# Patient Record
Sex: Female | Born: 1950 | Race: White | Hispanic: No | Marital: Married | Smoking: Never smoker
Health system: Southern US, Community
[De-identification: ages and names within clinical notes are randomized; demographics above are authoritative.]

## PROBLEM LIST (undated history)

## (undated) DIAGNOSIS — R531 Weakness: Secondary | ICD-10-CM

## (undated) DIAGNOSIS — M3500 Sicca syndrome, unspecified: Secondary | ICD-10-CM

## (undated) DIAGNOSIS — Q211 Atrial septal defect: Secondary | ICD-10-CM

## (undated) DIAGNOSIS — R202 Paresthesia of skin: Secondary | ICD-10-CM

## (undated) DIAGNOSIS — K900 Celiac disease: Secondary | ICD-10-CM

## (undated) DIAGNOSIS — E669 Obesity, unspecified: Secondary | ICD-10-CM

## (undated) DIAGNOSIS — R55 Syncope and collapse: Secondary | ICD-10-CM

## (undated) DIAGNOSIS — G2581 Restless legs syndrome: Secondary | ICD-10-CM

## (undated) DIAGNOSIS — R0789 Other chest pain: Secondary | ICD-10-CM

## (undated) DIAGNOSIS — R200 Anesthesia of skin: Secondary | ICD-10-CM

## (undated) DIAGNOSIS — R002 Palpitations: Secondary | ICD-10-CM

## (undated) DIAGNOSIS — E039 Hypothyroidism, unspecified: Secondary | ICD-10-CM

## (undated) DIAGNOSIS — I10 Essential (primary) hypertension: Secondary | ICD-10-CM

## (undated) DIAGNOSIS — I519 Heart disease, unspecified: Secondary | ICD-10-CM

## (undated) DIAGNOSIS — Q2112 Patent foramen ovale: Secondary | ICD-10-CM

## (undated) DIAGNOSIS — E063 Autoimmune thyroiditis: Secondary | ICD-10-CM

## (undated) DIAGNOSIS — M1990 Unspecified osteoarthritis, unspecified site: Secondary | ICD-10-CM

## (undated) DIAGNOSIS — Z8673 Personal history of transient ischemic attack (TIA), and cerebral infarction without residual deficits: Secondary | ICD-10-CM

## (undated) HISTORY — PX: COLON RESECTION: SHX5231

## (undated) HISTORY — DX: Celiac disease: K90.0

## (undated) HISTORY — DX: Paresthesia of skin: R20.2

## (undated) HISTORY — DX: Heart disease, unspecified: I51.9

## (undated) HISTORY — DX: Anesthesia of skin: R20.0

## (undated) HISTORY — DX: Personal history of transient ischemic attack (TIA), and cerebral infarction without residual deficits: Z86.73

## (undated) HISTORY — DX: Palpitations: R00.2

## (undated) HISTORY — DX: Sjogren syndrome, unspecified: M35.00

## (undated) HISTORY — DX: Weakness: R53.1

## (undated) HISTORY — DX: Essential (primary) hypertension: I10

## (undated) HISTORY — DX: Hypothyroidism, unspecified: E03.9

## (undated) HISTORY — PX: MENISCUS REPAIR: SHX5179

## (undated) HISTORY — DX: Autoimmune thyroiditis: E06.3

## (undated) HISTORY — DX: Atrial septal defect: Q21.1

## (undated) HISTORY — DX: Other chest pain: R07.89

## (undated) HISTORY — DX: Patent foramen ovale: Q21.12

## (undated) HISTORY — DX: Restless legs syndrome: G25.81

## (undated) HISTORY — DX: Syncope and collapse: R55

## (undated) HISTORY — DX: Obesity, unspecified: E66.9

## (undated) HISTORY — DX: Unspecified osteoarthritis, unspecified site: M19.90

## (undated) HISTORY — PX: APPENDECTOMY: SHX54

---

## 1980-10-14 HISTORY — PX: ABDOMINAL HYSTERECTOMY: SHX81

## 1995-10-15 HISTORY — PX: CERVICAL SPINE SURGERY: SHX589

## 2004-09-04 ENCOUNTER — Emergency Department: Payer: Self-pay | Admitting: General Practice

## 2005-12-27 ENCOUNTER — Ambulatory Visit: Payer: Self-pay | Admitting: Internal Medicine

## 2006-01-28 ENCOUNTER — Ambulatory Visit: Payer: Self-pay | Admitting: Internal Medicine

## 2006-03-25 ENCOUNTER — Ambulatory Visit: Payer: Self-pay | Admitting: Cardiology

## 2006-04-30 ENCOUNTER — Ambulatory Visit: Payer: Self-pay | Admitting: Internal Medicine

## 2008-06-07 ENCOUNTER — Encounter (HOSPITAL_COMMUNITY): Admission: RE | Admit: 2008-06-07 | Discharge: 2008-08-16 | Payer: Self-pay | Admitting: Endocrinology

## 2008-09-14 ENCOUNTER — Encounter (HOSPITAL_COMMUNITY): Admission: RE | Admit: 2008-09-14 | Discharge: 2008-10-13 | Payer: Self-pay | Admitting: Endocrinology

## 2008-10-14 HISTORY — PX: ASD REPAIR, SECUNDUM: SHX1195

## 2008-12-12 ENCOUNTER — Encounter: Admission: RE | Admit: 2008-12-12 | Discharge: 2008-12-12 | Payer: Self-pay | Admitting: Family Medicine

## 2008-12-23 ENCOUNTER — Encounter: Payer: Self-pay | Admitting: Emergency Medicine

## 2008-12-23 ENCOUNTER — Observation Stay (HOSPITAL_COMMUNITY): Admission: AD | Admit: 2008-12-23 | Discharge: 2008-12-24 | Payer: Self-pay | Admitting: Neurology

## 2008-12-23 ENCOUNTER — Encounter (INDEPENDENT_AMBULATORY_CARE_PROVIDER_SITE_OTHER): Payer: Self-pay | Admitting: Neurology

## 2008-12-23 ENCOUNTER — Ambulatory Visit: Payer: Self-pay | Admitting: Cardiology

## 2009-03-01 ENCOUNTER — Ambulatory Visit (HOSPITAL_COMMUNITY): Admission: RE | Admit: 2009-03-01 | Discharge: 2009-03-01 | Payer: Self-pay | Admitting: Cardiology

## 2009-03-01 ENCOUNTER — Ambulatory Visit: Payer: Self-pay | Admitting: Vascular Surgery

## 2009-03-01 ENCOUNTER — Encounter (HOSPITAL_COMMUNITY): Admission: RE | Admit: 2009-03-01 | Discharge: 2009-05-30 | Payer: Self-pay | Admitting: Endocrinology

## 2009-03-01 ENCOUNTER — Encounter (INDEPENDENT_AMBULATORY_CARE_PROVIDER_SITE_OTHER): Payer: Self-pay | Admitting: Cardiology

## 2009-03-24 ENCOUNTER — Inpatient Hospital Stay (HOSPITAL_COMMUNITY): Admission: AD | Admit: 2009-03-24 | Discharge: 2009-03-25 | Payer: Self-pay | Admitting: Cardiology

## 2009-03-24 ENCOUNTER — Encounter (INDEPENDENT_AMBULATORY_CARE_PROVIDER_SITE_OTHER): Payer: Self-pay | Admitting: Cardiology

## 2009-06-28 ENCOUNTER — Encounter (HOSPITAL_COMMUNITY): Admission: RE | Admit: 2009-06-28 | Discharge: 2009-08-23 | Payer: Self-pay | Admitting: Endocrinology

## 2009-08-21 ENCOUNTER — Encounter (HOSPITAL_COMMUNITY): Admission: RE | Admit: 2009-08-21 | Discharge: 2009-08-22 | Payer: Self-pay | Admitting: Endocrinology

## 2010-02-07 ENCOUNTER — Encounter (HOSPITAL_COMMUNITY): Admission: RE | Admit: 2010-02-07 | Discharge: 2010-04-26 | Payer: Self-pay | Admitting: Endocrinology

## 2010-07-07 ENCOUNTER — Emergency Department (HOSPITAL_COMMUNITY): Admission: EM | Admit: 2010-07-07 | Discharge: 2010-07-07 | Payer: Self-pay | Admitting: Emergency Medicine

## 2010-07-11 ENCOUNTER — Inpatient Hospital Stay (HOSPITAL_COMMUNITY): Admission: EM | Admit: 2010-07-11 | Discharge: 2010-07-18 | Payer: Self-pay | Admitting: Emergency Medicine

## 2010-07-13 ENCOUNTER — Encounter (INDEPENDENT_AMBULATORY_CARE_PROVIDER_SITE_OTHER): Payer: Self-pay | Admitting: Internal Medicine

## 2010-09-01 ENCOUNTER — Ambulatory Visit: Payer: Self-pay | Admitting: Psychiatry

## 2010-11-22 ENCOUNTER — Other Ambulatory Visit (HOSPITAL_COMMUNITY): Payer: Self-pay | Admitting: Obstetrics and Gynecology

## 2010-12-11 ENCOUNTER — Ambulatory Visit (INDEPENDENT_AMBULATORY_CARE_PROVIDER_SITE_OTHER): Admitting: Cardiology

## 2010-12-11 DIAGNOSIS — Q211 Atrial septal defect: Secondary | ICD-10-CM

## 2010-12-27 LAB — POCT I-STAT, CHEM 8
BUN: 8 mg/dL (ref 6–23)
Calcium, Ion: 1.12 mmol/L (ref 1.12–1.32)
Chloride: 110 mEq/L (ref 96–112)
Creatinine, Ser: 0.6 mg/dL (ref 0.4–1.2)
Glucose, Bld: 103 mg/dL — ABNORMAL HIGH (ref 70–99)
TCO2: 24 mmol/L (ref 0–100)

## 2010-12-27 LAB — CBC
HCT: 32.3 % — ABNORMAL LOW (ref 36.0–46.0)
HCT: 34.5 % — ABNORMAL LOW (ref 36.0–46.0)
HCT: 34.7 % — ABNORMAL LOW (ref 36.0–46.0)
HCT: 37.5 % (ref 36.0–46.0)
Hemoglobin: 13.5 g/dL (ref 12.0–15.0)
MCH: 28.3 pg (ref 26.0–34.0)
MCH: 28.4 pg (ref 26.0–34.0)
MCH: 28.4 pg (ref 26.0–34.0)
MCHC: 33.4 g/dL (ref 30.0–36.0)
MCHC: 33.6 g/dL (ref 30.0–36.0)
MCHC: 33.7 g/dL (ref 30.0–36.0)
MCHC: 33.9 g/dL (ref 30.0–36.0)
MCHC: 33.9 g/dL (ref 30.0–36.0)
MCV: 82.2 fL (ref 78.0–100.0)
MCV: 83.3 fL (ref 78.0–100.0)
MCV: 84.3 fL (ref 78.0–100.0)
MCV: 84.4 fL (ref 78.0–100.0)
Platelets: 173 10*3/uL (ref 150–400)
Platelets: 173 10*3/uL (ref 150–400)
Platelets: 178 10*3/uL (ref 150–400)
Platelets: 180 10*3/uL (ref 150–400)
Platelets: 197 10*3/uL (ref 150–400)
Platelets: 217 10*3/uL (ref 150–400)
Platelets: 225 10*3/uL (ref 150–400)
RBC: 4.78 MIL/uL (ref 3.87–5.11)
RDW: 15.9 % — ABNORMAL HIGH (ref 11.5–15.5)
RDW: 15.9 % — ABNORMAL HIGH (ref 11.5–15.5)
RDW: 16 % — ABNORMAL HIGH (ref 11.5–15.5)
RDW: 16.1 % — ABNORMAL HIGH (ref 11.5–15.5)
RDW: 16.2 % — ABNORMAL HIGH (ref 11.5–15.5)
RDW: 16.3 % — ABNORMAL HIGH (ref 11.5–15.5)
WBC: 11.7 10*3/uL — ABNORMAL HIGH (ref 4.0–10.5)
WBC: 4 10*3/uL (ref 4.0–10.5)
WBC: 7 10*3/uL (ref 4.0–10.5)

## 2010-12-27 LAB — DIFFERENTIAL
Basophils Absolute: 0 10*3/uL (ref 0.0–0.1)
Basophils Absolute: 0 10*3/uL (ref 0.0–0.1)
Basophils Relative: 0 % (ref 0–1)
Eosinophils Absolute: 0.1 10*3/uL (ref 0.0–0.7)
Eosinophils Relative: 0 % (ref 0–5)
Eosinophils Relative: 1 % (ref 0–5)
Lymphocytes Relative: 9 % — ABNORMAL LOW (ref 12–46)
Monocytes Absolute: 0.5 10*3/uL (ref 0.1–1.0)
Monocytes Absolute: 0.8 10*3/uL (ref 0.1–1.0)
Neutro Abs: 10.1 10*3/uL — ABNORMAL HIGH (ref 1.7–7.7)

## 2010-12-27 LAB — COMPREHENSIVE METABOLIC PANEL
ALT: 26 U/L (ref 0–35)
AST: 23 U/L (ref 0–37)
AST: 24 U/L (ref 0–37)
Albumin: 2.9 g/dL — ABNORMAL LOW (ref 3.5–5.2)
Albumin: 3.3 g/dL — ABNORMAL LOW (ref 3.5–5.2)
Alkaline Phosphatase: 34 U/L — ABNORMAL LOW (ref 39–117)
Alkaline Phosphatase: 39 U/L (ref 39–117)
BUN: 1 mg/dL — ABNORMAL LOW (ref 6–23)
BUN: 13 mg/dL (ref 6–23)
CO2: 26 mEq/L (ref 19–32)
Chloride: 108 mEq/L (ref 96–112)
GFR calc Af Amer: >60 mL/min (ref >60)
GFR calc non Af Amer: >60 mL/min (ref >60)
Potassium: 3.4 mEq/L — ABNORMAL LOW (ref 3.5–5.1)
Potassium: 3.7 mEq/L (ref 3.5–5.1)
Sodium: 144 mEq/L (ref 135–145)
Total Bilirubin: 1 mg/dL (ref 0.3–1.2)
Total Protein: 5.2 g/dL — ABNORMAL LOW (ref 6.0–8.3)

## 2010-12-27 LAB — URINALYSIS, ROUTINE W REFLEX MICROSCOPIC
Bilirubin Urine: NEGATIVE
Bilirubin Urine: NEGATIVE
Glucose, UA: NEGATIVE mg/dL
Glucose, UA: NEGATIVE mg/dL
Hgb urine dipstick: NEGATIVE
Hgb urine dipstick: NEGATIVE
Ketones, ur: 15 mg/dL — AB
Ketones, ur: NEGATIVE mg/dL
Nitrite: NEGATIVE
Specific Gravity, Urine: 1.02 (ref 1.005–1.030)
pH: 6 (ref 5.0–8.0)
pH: 6 (ref 5.0–8.0)

## 2010-12-27 LAB — STOOL CULTURE

## 2010-12-27 LAB — ABO/RH: ABO/RH(D): O POS

## 2010-12-27 LAB — GIARDIA/CRYPTOSPORIDIUM SCREEN(EIA): Giardia Screen - EIA: NEGATIVE

## 2010-12-27 LAB — URINE MICROSCOPIC-ADD ON

## 2010-12-27 LAB — GASTRIN: Gastrin: 15 pg/mL (ref <101)

## 2010-12-27 LAB — RETICULOCYTES
RBC.: 4.28 MIL/uL (ref 3.87–5.11)
Retic Ct Pct: 0.9 % (ref 0.4–3.1)

## 2010-12-27 LAB — IRON AND TIBC
Saturation Ratios: 12 % — ABNORMAL LOW (ref 20–55)
UIBC: 330 ug/dL

## 2010-12-27 LAB — FOLATE: Folate: 9.7 ng/mL

## 2010-12-27 LAB — T4, FREE: Free T4: 0.66 ng/dL — ABNORMAL LOW (ref 0.80–1.80)

## 2010-12-27 LAB — CK: Total CK: 98 U/L (ref 7–177)

## 2011-01-21 LAB — POCT I-STAT 3, VENOUS BLOOD GAS (G3P V)
Bicarbonate: 24.6 mEq/L — ABNORMAL HIGH (ref 20.0–24.0)
O2 Saturation: 73 %
O2 Saturation: 77 %
O2 Saturation: 84 %
TCO2: 25 mmol/L (ref 0–100)
TCO2: 26 mmol/L (ref 0–100)
pCO2, Ven: 36.4 mmHg — ABNORMAL LOW (ref 45.0–50.0)
pCO2, Ven: 39.9 mmHg — ABNORMAL LOW (ref 45.0–50.0)
pH, Ven: 7.38 — ABNORMAL HIGH (ref 7.250–7.300)
pH, Ven: 7.396 — ABNORMAL HIGH (ref 7.250–7.300)
pO2, Ven: 48 mmHg — ABNORMAL HIGH (ref 30.0–45.0)

## 2011-01-21 LAB — BASIC METABOLIC PANEL
Chloride: 103 mEq/L (ref 96–112)
GFR calc Af Amer: >60 mL/min (ref >60)
Potassium: 3.4 mEq/L — ABNORMAL LOW (ref 3.5–5.1)
Sodium: 137 mEq/L (ref 135–145)

## 2011-01-21 LAB — CBC
HCT: 37 % (ref 36.0–46.0)
MCV: 84.3 fL (ref 78.0–100.0)
RBC: 4.39 MIL/uL (ref 3.87–5.11)
WBC: 7.3 10*3/uL (ref 4.0–10.5)

## 2011-01-24 LAB — URINALYSIS, ROUTINE W REFLEX MICROSCOPIC
Nitrite: NEGATIVE
Specific Gravity, Urine: 1.02 (ref 1.005–1.030)
pH: 6 (ref 5.0–8.0)

## 2011-01-24 LAB — COMPREHENSIVE METABOLIC PANEL
ALT: 25 U/L (ref 0–35)
Alkaline Phosphatase: 68 U/L (ref 39–117)
Glucose, Bld: 100 mg/dL — ABNORMAL HIGH (ref 70–99)
Potassium: 3.9 mEq/L (ref 3.5–5.1)
Sodium: 140 mEq/L (ref 135–145)
Total Protein: 6.7 g/dL (ref 6.0–8.3)

## 2011-01-24 LAB — C-REACTIVE PROTEIN: CRP: 0.1 mg/dL — ABNORMAL LOW (ref <0.6)

## 2011-01-24 LAB — GLUCOSE, CAPILLARY: Glucose-Capillary: 114 mg/dL — ABNORMAL HIGH (ref 70–99)

## 2011-01-24 LAB — DIFFERENTIAL
Basophils Relative: 1 % (ref 0–1)
Eosinophils Absolute: 0.2 10*3/uL (ref 0.0–0.7)
Eosinophils Relative: 4 % (ref 0–5)
Monocytes Absolute: 0.6 10*3/uL (ref 0.1–1.0)
Monocytes Relative: 8 % (ref 3–12)
Neutrophils Relative %: 56 % (ref 43–77)

## 2011-01-24 LAB — URINE MICROSCOPIC-ADD ON

## 2011-01-24 LAB — ANA
Anti Nuclear Antibody(ANA): POSITIVE — AB
Anti Nuclear Antibody(ANA): POSITIVE — AB

## 2011-01-24 LAB — ANTI-NUCLEAR AB-TITER (ANA TITER): ANA Titer 1: NEGATIVE

## 2011-01-24 LAB — CBC
Hemoglobin: 13.8 g/dL (ref 12.0–15.0)
MCHC: 33.5 g/dL (ref 30.0–36.0)
MCV: 87.2 fL (ref 78.0–100.0)
RBC: 4.73 MIL/uL (ref 3.87–5.11)

## 2011-01-24 LAB — CK TOTAL AND CKMB (NOT AT ARMC): CK, MB: 6.3 ng/mL — ABNORMAL HIGH (ref 0.3–4.0)

## 2011-01-24 LAB — HEMOGLOBIN A1C
Hgb A1c MFr Bld: 5.9 % (ref 4.6–6.1)
Mean Plasma Glucose: 123 mg/dL

## 2011-01-24 LAB — LIPID PANEL: VLDL: 13 mg/dL (ref 0–40)

## 2011-01-24 LAB — B. BURGDORFI ANTIBODIES: B burgdorferi Ab IgG+IgM: 0.05 {ISR}

## 2011-01-24 LAB — RAPID URINE DRUG SCREEN, HOSP PERFORMED: Tetrahydrocannabinol: NOT DETECTED

## 2011-01-24 LAB — HOMOCYSTEINE: Homocysteine: 5.9 umol/L (ref 4.0–15.4)

## 2011-01-24 LAB — CARDIAC PANEL(CRET KIN+CKTOT+MB+TROPI): Relative Index: 2.4 (ref 0.0–2.5)

## 2011-02-26 NOTE — Discharge Summary (Signed)
Erica Wheeler, Erica Wheeler NO.:  [id number]   MEDICAL RECORD NO.:  [id number]          PATIENT TYPE:  INP   LOCATION:  2507                         FACILITY:  [facility]   PHYSICIAN:  Cristy Hilts. Jacinto Halim, MD       DATE OF BIRTH:  11/24/1950   DATE OF ADMISSION:  03/24/2009  DATE OF DISCHARGE:  03/25/2009                               DISCHARGE SUMMARY   DISCHARGE DIAGNOSES:  1. Atrial septal defect.      a.     Closure of atrial septal defect with septal occluder.  2. History of multiple transient ischemic attacks.  3. Premature family history of coronary disease, vascular disease, and      stroke.  4. Hypertension, treated.   DISCHARGE CONDITION:  Stable.   PROCEDURES:  Patent foramen ovale closure, on March 24, 2009, by Dr. Yates Decamp.   DISCHARGE MEDICATIONS:  1. Lovaza 1000 mg daily.  2. Vitamin D 50,000 international units weekly.  3. Shaklee vitamin three times daily.  4. Vitamin B12 shots every other week.  5. Armour Thyroid 60 mg one and a half daily.  6. Lisinopril and hydrochlorothiazide 20/12.5 daily.  7. Aspirin 81 mg daily.  8. Plavix 75 mg one daily for 3 months.   DISCHARGE INSTRUCTIONS:  1. May return to work on April 18, 2009.  2. Low-sodium heart-healthy diet.  3. Wash cath site with soap and water.  Call if any bleeding,      swelling, or drainage.  4. Increase activity slowly.  May shower.  No lifting for 1 week.  No      driving for 2 days.  5. You will need a 2D echo in 6 months.  Office will call and arrange      with you.  6. Follow up with Dr. Garen Lah in 4 months.   HISTORY OF PRESENT ILLNESS:  A 60 year old female with multiple episodes  of TIA, last TIA December 23, 2008, was seen by Dr. Jacinto Halim after a  transcranial bubble study was done to evaluate paradoxical shunting,  which revealed intracardiac shunting of at least moderate size.  She  also had complained of shortness of breath, dyspnea on exertion, and  some chest discomfort.  She was  seen and evaluated.  Also, has a history  for labile hypertension.  She was set up for PFO closure.   The patient presented electively to [facility] on March 24, 2009, and  underwent closure of moderate-sized ASD and tolerated procedure well.  Next morning, she was stable.  Vital signs were stable.  Blood pressure  107/38, pulse 60, temperature 98.1.  Labs were stable with hemoglobin  12.4, hematocrit 37.  Potassium was 3.4.  She was given 40 mEq of  potassium prior to discharge.  Heart, S1 and S2, no gallop.  Lungs were  clear.  Right groin was without  hematoma.  She did have 2D echo results, Dr. Jacinto Halim reviewed and are  currently pending in the computer at the time of discharge.   The patient will follow up as an outpatient and will  call if she has  problems in the meantime.      Darcella Gasman. Ingold, N.P.      Cristy Hilts. Jacinto Halim, MD     LRI/MEDQ  D:  03/25/2009  T:  03/26/2009  Job:  [id number]   cc:   Genene Churn. Love, M.D.  Dorisann Frames, M.D.

## 2011-02-26 NOTE — Cardiovascular Report (Signed)
NAMEJAE, BRUCK NO.:  [id number]   MEDICAL RECORD NO.:  [id number]          PATIENT TYPE:  INP   LOCATION:  2807                         FACILITY:  [facility]   PHYSICIAN:  Cristy Hilts. Jacinto Halim, MD       DATE OF BIRTH:  [birth year]   DATE OF PROCEDURE:  03/24/2009  DATE OF DISCHARGE:                            CARDIAC CATHETERIZATION   PROCEDURE PERFORMED:  Intracardiac echo guided closure of the secundum-  type atrial septal defect.   INDICATIONS:  Shortness of breath, right ventricular strain, and TIA.   The patient is a 60 year old female with history of multiple episodes of  TIA.  Last TIA on December 23, 2008.  She has also been complaining of  shortness of breath and dyspnea on exertion, which is significantly  worse than the last 1 year.  A transesophageal echocardiogram to  evaluate for cardiac source of cerebral emboli had yielded a small-to-  moderate sized atrial septal defect of secundum type.  Hence she is now  brought to the catheterization lab for intracardiac echo-guided closure  of the same.   Intracardiac echo guidance revealed presence of a 9-mm atrial septal  defect of secundum type.  There is also a small fenestrated septum seen  just adjacent to the defect.   QP:QS calculation was performed by obtaining SVC, IVC, RA, LA  saturations and was calculated to be 2.1.   INTERVENTION DATA:  Successful closure of the secundum-type atrial  septal defect with implantation of a 10-mm ASO Amplatzer septal occluder  with very minimal residual leak through the device.  Excellent  positioning of the device was noted.   RECOMMENDATIONS:  I expect complete closure of the atrial septal defect  over the next few weeks to few months.  She will be placed on aspirin 81  mg 2 tablets p.o. daily indefinitely and Plavix 75 mg p.o. daily for at  least 3 months.  She will need endocarditis prophylaxis for a total of 6  months.   An echocardiogram will be performed in  the morning prior to her  discharge to exclude pericardial effusion.   TECHNIQUE OF PROCEDURE:  Under usual sterile precautions using a 8-  [place] right femoral venous and a 9-French right femoral venous access,  intracardiac echo probe was advanced and the intracardiac structures  were carefully analyzed.  There was no obvious valvular abnormalities  noted.  Then we evaluated the atrial septal defect.  A 6-French  multipurpose A2 catheter was utilized to cross the atrial septal defect  and the defect was crossed and the catheter was placed into the left  inferior pulmonary vein.  Using a 300-cm Amplatzer wire, the  multipurpose catheter was withdrawn and a septal occluder sizing balloon  was utilized and careful inflation until the color flow completely  stopped, revealed a 9 mm defect both by fluoroscopic measurement and  also by intracardiac measurements.  Having obtained good sizing, we  decided to go with a 10-mm septal occluder implantation, which was  performed in the usual fashion.  A 9-French right femoral venous sheath  was  placed over the Amplatzer wire and the sheath was carefully crossed  and placed into the middle of the left atrium.  The ASO device was  carefully prepped in the usual fashion making sure that there was no air  in the catheter and the device was carefully advanced through the  introducer sheath into left atrium and left atrial disk was carefully  deployed under fluoroscopic and also intracardiac echo guidance.  After  careful positioning of the left atrial disk, right atrial disk was  released.  [place] tug was carefully performed to evaluate the  positioning and stability of the device and then the device was released  with  excellent results.  The 9-French delivery sheath was then exchanged to a  short 9-French sheath.  During the process, intravenous heparin was  administered and ACT was maintained greater than 200.  The patient  tolerated the  procedure well.  There were no immediate complications  noted.      Cristy Hilts. Jacinto Halim, MD  Electronically Signed     JRG/MEDQ  D:  03/24/2009  T:  03/24/2009  Job:  [id number]   cc:   Evie Lacks, MD

## 2011-02-26 NOTE — H&P (Signed)
NAMEDIANAH, PRUETT NO.:  [id number]   MEDICAL RECORD NO.:  [id number]          PATIENT TYPE:  INP   LOCATION:  3007                         FACILITY:  [facility]   PHYSICIAN:  Melvyn Novas, M.D.  DATE OF BIRTH:  [birth year]   DATE OF ADMISSION:  12/23/2008  DATE OF DISCHARGE:                              HISTORY & PHYSICAL   She was seen at the [facility] Emergency Room prior to transfer to  [facility] Stroke Unit.   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 to take  her grandson to school.  Her daughter called her and felt that her  mother was confused and upset yet she spoke clearly.  She was very emotional even then about the incident.  When the daughter came here to the ER to see her mother at the bedside,  she stated that since her telephone conversation at 8 a.m., now at 11  a.m., her mother's speech has deteriorated, she is nonfluent and her  spontaneous verbal expressions are redundant.  She repeats herself  multiple times.  She has trouble reading..  She has trouble repeating.  Interestingly, Ms. Brueggemann drove here by private car.  Ms. Hoggard is a  patient of Dr. Evelena Peat and Dr. Dorisann Frames who follows her  for thyroid disease.  She has a history of TIAs 10 years ago and lived  in [place] at that time.  She took baby aspirin for a while.  She does not recall what medication  she is on now, but she is on a thyroid medication.  She does not states that she takes aspirin now daily.  She has an  appointment on January 04, 2009, with Dr. Sandria Manly at our office, but has not  yet been seen.  A CT had been ordered through a primary care physician,  I suppose Dr. Caryl Never, on December 12, 2008 showed a hypodensity on an  unenhanced CT in the neighborhood of the left posterior horn.  She has now had a repeat CT, although we requested this to be cancelled  prior to seeing the patient and this repeat CT shows in my opinion no  change.  She is awaiting an MRI right now.   CURRENT MEDICATIONS:  Cannot be named.   ALLERGIES:  Cannot be named.   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  She is repetitively saying I am a banker, I need to think  clear, I need to think clear, I need to think clear.   She cannot give me a family history.   PHYSICAL EXAMINATION:  VITAL SIGNS:  145/70 mmHg blood pressure, heart  rate 76 beats per minute and regular, respiratory rate 16-18.  LUNGS:  Clear to auscultation.  COR:  Regular rate and rhythm.  No murmur.  No carotid bruit.  No  temporal artery induration.  Normal pulses.  No swelling, edema, or  rash, abdominal pain, chest pain, or paraspinal tenderness.  The patient is crying and appears emotionally very taxed.  She is  concerned about her inability to speak fluently and repeats multiple  times something is wrong with me.  She cannot name spontaneously.  Repetition is haltered and her spontaneous output of speech is poor.   She has equally reactive pupils nystagmus to the gaze, appears to have  left lower facial droop, but no gaze preference, inability to whistle,  blow up her cheeks, or close her eyes tightly.  Her daughter states that she sees no change in her facial expression in  comparison to the last couple of days.  She has sensory loss over the  right face in all three branches of the trigeminal nerve.  She also states that primary modalities such as touch filament and  temperature in arm, shoulder leg on the right side and part of the right  trunk are decreased.  She feels numbish, but she denies having pain or having tingling  sensation.  Right grip appeared equal to the left now, but was initially describes  as weaker.  There is a pronator drift on the right, but equal  deep  tendon reflexes.  Finger-nose-finger shows no dysmetria.  She has downgoing toes.  No leg drift.  She states that her right leg is cramping as she rubs it and massages  it, but her gait was unimpaired when she walked to the bathroom  witnessed by her ER physician previously.  She is now little calmer and I have again tried for her to name objects  on a paper sheet and to describe the picture of the NIH stroke scale  with a cookie jar.  She is able to do this, but it takes her unusual long time and she often  haltered or stopped in the middle of the sentence.   ASSESSMENT:  Possible history of transient ischemic attacks for several  days.  Initiation of the CT scan on December 12, 2008, was based in events taking  place at that time.  She had at that time a right facial numbness and today new seems to be  the aphasia and headache in the right temple.  She believes she  developed those about 8 a.m., but she cannot be sure.  No tPA was given since the onset time is unclear and the NIH stroke  scale was only endorsed at 3 points.   PLAN:  1. MRI.  Transfer to [facility] for further workup with a Stroke Service for      possibleTIA- ICD: 434.91.  2. The patient will receive aspirin.  3. We will try to contact Dr. Willeen Cass office to see what medications      the patient is currently on especially her thyroid medication, and      also if the patient has listed aspirin or any multivitamin      supplement.  The patient will be admitted to Stroke MD for observational stay.      Melvyn Novas, M.D.  Electronically Signed     CD/MEDQ  D:  12/23/2008  T:  12/24/2008  Job:  [id number]

## 2011-02-26 NOTE — Discharge Summary (Signed)
NAMECHESSIE, NEUHARTH NO.:  [id number]   MEDICAL RECORD NO.:  [id number]          PATIENT TYPE:  INP   LOCATION:  3007                         FACILITY:  [facility]   PHYSICIAN:  Melvyn Novas, M.D.  DATE OF BIRTH:  [birth year]   DATE OF ADMISSION:  12/23/2008  DATE OF DISCHARGE:  12/24/2008                               DISCHARGE SUMMARY   Erica Wheeler was admitted for an observational stay under the presumed  diagnosis of TIA on December 23, 2008.  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  The patient underwent a TIA workup.  During this brief observational stay, which has been completed today on  December 24, 2008, and her 2D echo has not been read yet, but a preliminary  report stated no abnormalities.    A preliminary report for the vascular lab coronary Doppler study shows  no ICA stenosis and antegrade flow in the vertebral arteries.  C-  reactive protein was 0.1, cholesterol 139, LDL 72, and HDL 54.  The  patient had a normal urinalysis.  No evidence of UTI.  Negative tox  screen and alcohol screen.  HbA1c was 5.9 and troponin 0.04.  CK and CK-  MB were elevated; CK at 222 and CK-MB at 6.3.  Relative index was also  elevated at 2.8.  The patient had, however, a normal Chem-7 with  slightly elevated glucose, but she had not fastened.  SGOT and SGPT were  normal.  Alk phos was normal.  PT and INR were normal.  INR was 0.9.  H  and H was 13.8/14.2, platelet count was 198,000, and white blood cell  count 7.  EKG in the ER showed a possible digitalis effect and this  patient who is not on digitalis.   PHYSICAL EXAMINATION:  The patient reports persistent right sided  numbness.   Her MRI has been negative for an acute stroke that shows white matter  disease with a more advanced  degree in the left hemisphere.  Face and  body is not involved in this numbness, but sometimes has a dysaesthetic  sensation to it, but the patient has regained fluent speech.  She  appears still extremely anxious and somewhat jittery.    Given her past medical history of a difficult to control thyroid  dysfunction, I wonder if her mental status change and her degree of  anxiety could be related to the thyroid disease.  I explained to her that she did not suffer an acute stroke, but that she  should keep her outpatient workup with appointment with Dr. Sandria Manly for  next week.  The diagnosis at this time is TIA versus white matter disease of  demyelination or vasculitic origin.  We will repeat prior to her leaving  for home today as CK, CK-MBs, and troponin as well as an EKGs.  I have placed the patient on aspirin, which is the only new medicine  she  is on.     A vasculitis panel has been ordered.  Tylenol can be given p.r.n. for  headaches.  The patient will continue Dr. Willeen Cass prescription for  Armour Thyroid at 90 mcg daily and her multivitamins.  Her outpatient workup may include a depression and anxiety evaluation as  well as a neurologic examination.      Melvyn Novas, M.D.  Electronically Signed     CD/MEDQ  D:  12/24/2008  T:  12/24/2008  Job:  [id number]   cc:   Dorisann Frames, M.D.  Genene Churn. Love, M.D.

## 2011-04-02 ENCOUNTER — Inpatient Hospital Stay (HOSPITAL_COMMUNITY)
Admission: EM | Admit: 2011-04-02 | Discharge: 2011-04-05 | DRG: 556 | Disposition: A | Discharge status: Home / Self Care | Attending: Internal Medicine | Admitting: Internal Medicine

## 2011-04-02 ENCOUNTER — Emergency Department (HOSPITAL_COMMUNITY)

## 2011-04-02 DIAGNOSIS — IMO0001 Reserved for inherently not codable concepts without codable children: Principal | ICD-10-CM | POA: Diagnosis present

## 2011-04-02 DIAGNOSIS — Z8673 Personal history of transient ischemic attack (TIA), and cerebral infarction without residual deficits: Secondary | ICD-10-CM

## 2011-04-02 DIAGNOSIS — R197 Diarrhea, unspecified: Secondary | ICD-10-CM | POA: Diagnosis present

## 2011-04-02 DIAGNOSIS — F17200 Nicotine dependence, unspecified, uncomplicated: Secondary | ICD-10-CM | POA: Diagnosis present

## 2011-04-02 DIAGNOSIS — Z880 Allergy status to penicillin: Secondary | ICD-10-CM

## 2011-04-02 DIAGNOSIS — Z885 Allergy status to narcotic agent status: Secondary | ICD-10-CM

## 2011-04-02 DIAGNOSIS — E039 Hypothyroidism, unspecified: Secondary | ICD-10-CM | POA: Diagnosis present

## 2011-04-02 DIAGNOSIS — K5730 Diverticulosis of large intestine without perforation or abscess without bleeding: Secondary | ICD-10-CM | POA: Diagnosis present

## 2011-04-02 DIAGNOSIS — M3500 Sicca syndrome, unspecified: Secondary | ICD-10-CM | POA: Diagnosis present

## 2011-04-02 DIAGNOSIS — I10 Essential (primary) hypertension: Secondary | ICD-10-CM | POA: Diagnosis present

## 2011-04-02 DIAGNOSIS — E860 Dehydration: Secondary | ICD-10-CM | POA: Diagnosis present

## 2011-04-02 DIAGNOSIS — Z882 Allergy status to sulfonamides status: Secondary | ICD-10-CM

## 2011-04-02 DIAGNOSIS — K900 Celiac disease: Secondary | ICD-10-CM | POA: Diagnosis present

## 2011-04-02 LAB — POCT I-STAT, CHEM 8
BUN: 14 mg/dL (ref 6–23)
Chloride: 108 mEq/L (ref 96–112)
Creatinine, Ser: 0.6 mg/dL (ref 0.50–1.10)
Potassium: 3.7 mEq/L (ref 3.5–5.1)
Sodium: 140 mEq/L (ref 135–145)

## 2011-04-02 LAB — AMYLASE: Amylase: 59 U/L (ref 0–105)

## 2011-04-03 DIAGNOSIS — R195 Other fecal abnormalities: Secondary | ICD-10-CM

## 2011-04-03 DIAGNOSIS — R197 Diarrhea, unspecified: Secondary | ICD-10-CM

## 2011-04-03 LAB — CBC
MCH: 28.4 pg (ref 26.0–34.0)
MCHC: 34 g/dL (ref 30.0–36.0)
MCV: 83.6 fL (ref 78.0–100.0)
Platelets: 160 10*3/uL (ref 150–400)
RBC: 4.26 MIL/uL (ref 3.87–5.11)

## 2011-04-03 LAB — BASIC METABOLIC PANEL
BUN: 8 mg/dL (ref 6–23)
CO2: 27 mEq/L (ref 19–32)
Calcium: 8.5 mg/dL (ref 8.4–10.5)
Creatinine, Ser: <0.47 mg/dL — ABNORMAL LOW (ref 0.50–1.10)
Glucose, Bld: 97 mg/dL (ref 70–99)

## 2011-04-03 LAB — COMPREHENSIVE METABOLIC PANEL
ALT: 23 U/L (ref 0–35)
AST: 24 U/L (ref 0–37)
BUN: 8 mg/dL (ref 6–23)
CO2: 27 mEq/L (ref 19–32)
Calcium: 8.8 mg/dL (ref 8.4–10.5)
Creatinine, Ser: 0.51 mg/dL (ref 0.50–1.10)
GFR calc Af Amer: >60 mL/min (ref >60)
GFR calc non Af Amer: >60 mL/min (ref >60)
Sodium: 139 mEq/L (ref 135–145)
Total Protein: 5.5 g/dL — ABNORMAL LOW (ref 6.0–8.3)

## 2011-04-03 LAB — VITAMIN B12: Vitamin B-12: >2000 pg/mL — ABNORMAL HIGH (ref 211–911)

## 2011-04-03 LAB — FERRITIN: Ferritin: 59 ng/mL (ref 10–291)

## 2011-04-04 DIAGNOSIS — K900 Celiac disease: Secondary | ICD-10-CM

## 2011-04-04 DIAGNOSIS — R197 Diarrhea, unspecified: Secondary | ICD-10-CM

## 2011-04-04 DIAGNOSIS — IMO0001 Reserved for inherently not codable concepts without codable children: Secondary | ICD-10-CM

## 2011-04-04 LAB — BASIC METABOLIC PANEL
BUN: 10 mg/dL (ref 6–23)
Calcium: 8.9 mg/dL (ref 8.4–10.5)
GFR calc Af Amer: >60 mL/min (ref >60)
GFR calc non Af Amer: >60 mL/min (ref >60)
Glucose, Bld: 135 mg/dL — ABNORMAL HIGH (ref 70–99)

## 2011-04-04 LAB — VITAMIN D 25 HYDROXY (VIT D DEFICIENCY, FRACTURES): Vit D, 25-Hydroxy: 43 ng/mL (ref 30–89)

## 2011-04-05 LAB — BASIC METABOLIC PANEL
BUN: 9 mg/dL (ref 6–23)
Calcium: 9.2 mg/dL (ref 8.4–10.5)
Creatinine, Ser: 0.54 mg/dL (ref 0.50–1.10)
GFR calc Af Amer: >60 mL/min (ref >60)
GFR calc non Af Amer: >60 mL/min (ref >60)

## 2011-04-05 LAB — VITAMIN D 1,25 DIHYDROXY
Vitamin D 1, 25 (OH)2 Total: 56 pg/mL (ref 18–72)
Vitamin D3 1, 25 (OH)2: 56 pg/mL

## 2011-04-10 NOTE — Consult Note (Signed)
Erica Wheeler, Erica Wheeler NO.:  [id number]  MEDICAL RECORD NO.:  [id number]  LOCATION:  4508                         FACILITY:  [facility]  PHYSICIAN:  Jordan Hawks. Elnoria Howard, MD    DATE OF BIRTH:  18-Apr-1951  DATE OF CONSULTATION:  04/03/2011 DATE OF DISCHARGE:                                CONSULTATION   REASON FOR CONSULTATION:  Diarrhea.  PRIMARY CARE PROVIDER:  Warrick Parisian, MD  HISTORY OF PRESENT ILLNESS:  This is a 60 year old female with a past medical history Sj"gren's syndrome, celiac disease status post subtotal colectomy for diverticulitis, chronic diarrhea, Hashimoto's thyroiditis and fibromyalgia with severe myalgias who is admitted to the hospital with complaints of severe myalgia as well as a 2-3 month history of watery diarrhea.  The patient reports that her symptoms of myalgia started again 2-3 days after the death of her sister, unknown if this was accidental suicide or intentional.  Her sister lived in [place] and her husband is an alcoholic and her husband is not able to be found at this time.  She is the only one that will take responsibility for her sister's body and this has caused a significant amount of stress as legally it may not be possible for her to make arrangements if her husband is still alive.  Two to three days after finding out that her sister had passed, she started having the myalgia complaints again. Previously she was hospitalized approximately a year to year and a half ago with very similar complaints.  It was quite debilitating and she reported that she felt as if her muscles were being torn from her bone. She ultimately was evaluated at [facility] and the conclusion was that she had myalgia over the intervening time.  The symptoms seem to have resolved on its own and she has been doing quite well until this time.  Also her diarrhea restarted 2-3 months ago with 10-15 large watery bowel movements per day.  Since the  onset of her diarrhea, she has been trying to manage at home but at this time she finds it very difficult to manage the situation.  When she was hospitalized previously, it was noted that her celiac disease was in check.  Her titers were low, however, more importantly she is a very reliable patient and stays strictly on a gluten-free diet.  Despite being on a strict gluten-free diet, the patient had issues with diarrhea in the past.  It is as a result of this issue that GI consultation was requested.  Prior conservative therapies did not benefit her and there was a thought of using Lotronex but she was concerned about the side effects of the medications.  PAST MEDICAL AND PAST SURGICAL HISTORY:  As stated above.  FAMILY HISTORY:  Noncontributory.  SOCIAL HISTORY:  Negative for alcohol, tobacco, or illicit drug use.  ALLERGIES:  PENICILLIN, SULFA, and CODEINE.  MEDICATIONS: 1. Os-Cal 500 mg p.o. daily. 2. Vitamin D3 5000 units p.o. daily. 3. Vitamin B12 1000 mcg IM every 2 weeks. 4. Lomotil 2 tablets p.o. q.8 h. 5. Cymbalta 60 mg p.o. at bedtime. 6. Lovenox 40 mg subcu at bedtime. 7. Hydrochlorothiazide 12.5  mg p.o. daily. 8. Lisinopril 10 mg p.o. daily. 9. Progesterone 200 mg p.o. at bedtime. 10.Valium 5 mg IV q.4 h. p.r.n. 11.Morphine 4 mg IV q.2 h. p.r.n. 12.Zofran 4 mg IV q.6 h. 13.Ambien 5 mg p.o. at bedtime. 14.Levothyroxine 120 mg p.o. daily.  PHYSICAL EXAMINATION:  VITAL SIGNS:  Blood pressure is 120/68, heart rate is 74, temperature is 97.4. GENERAL:  The patient is in no acute distress, alert and oriented. HEENT:  Normocephalic, atraumatic.  Extraocular muscles intact. NECK:  Supple.  No lymphadenopathy. LUNGS:  Clear to auscultation bilaterally. CARDIOVASCULAR:  Regular rate and rhythm. ABDOMEN:  Flat, soft, mild tenderness.  No rebound or rigidity. Positive bowel sounds. EXTREMITIES:  No clubbing, cyanosis, or edema.  LABORATORY VALUES:  Sodium is 140,  potassium 3.6, chloride 109, CO2 27, glucose 97, BUN 8, creatinine less than 0.4.  IMPRESSION: 1. Diarrhea, questionable etiology. 2. History of celiac disease. 3. Myalgias with history of fibromyalgia.  At this time, the patient is uncomfortable as a result of the myalgias and I believe this is stress-induced.  She certainly has a significant amount of stress with passing of her sister and she has to deal with the ramifications from a long distance.  Hopefully this issue will resolve and it can help to abate the pain issue, but it may be prudent to request a rheumatology consult to see if anything can be provided to help assist with her care.  In the past, I noted that the standard pain medications that is narcotic and anti-inflammatories did not improve the situation.  As for her diarrhea, in the past there was discussion of Lotronex but she was concerned about the side effects.  I think at this time since she is in the hospital, it would be reasonable to give her octreotide and conservative measures with cholestyramine and also Imodium was not of any benefit for her.  I hope that this will bring some type of improvement to the situation.  I doubt that her celiac disease is causing a significant issue at this time.  She is quite reliable with her celiac diet.  Plan is for octreotide 100 mcg q.8 h. and also DC the Lovenox as she complains of having significant pain with injection and she refuses and does not want to have the medication any further.  She will be changed over to SCD compression boots.     Jordan Hawks Elnoria Howard, MD    PDH/MEDQ  D:  04/03/2011  T:  04/03/2011  Job:  [id number]  cc:   Warrick Parisian, MD  Electronically Signed by Jeani Hawking MD on 04/10/2011 06:57:46 AM

## 2011-04-17 NOTE — Discharge Summary (Signed)
NAMEMILIANA, [name] NO.:  [id number]  MEDICAL RECORD NO.:  [id number]  LOCATION:  4508                         FACILITY:  [facility]  PHYSICIAN:  Blanch Media, M.D.DATE OF BIRTH:  [birth year]  DATE OF ADMISSION:  04/02/2011 DATE OF DISCHARGE:  04/05/2011                              DISCHARGE SUMMARY   DISCHARGE DIAGNOSES: 1. Muscle cramps and aches, unclear etiology likely secondary to     fibromyalgia exacerbated by recent stress versus dehydration at     presentation secondary to chronic diarrhea. 2. Chronic diarrhea, watery stools 8-10 times per day likely secondary     to total abdominal colectomy and ileoproctostomy done in 2006 at     [facility]. 3. Celiac disease. 4. Total abdominal colectomy with ileoproctostomy 5. History of patent foramen ovale closure. 6. History of Hashimoto thyroiditis on Armour Thyroid 120 mg daily     now. 7. Hypertension on hydrochlorothiazide/lisinopril.  DISCHARGE MEDICATIONS: 1. Cholestyramine 2 g by mouth twice a day. 2. Diazepam 5 mg tablets, take 1/2 tablet every 4 hours as needed for     cramps and pain. 3. Morphine 15 mg tablet, take 7.5 mg that is 1/2 tablet by mouth     every 4 hours as needed for cramps and pain. 4. Lomotil, take 2 tablets by mouth every 8 hours for diarrhea. 5. Octreotide 100 mcg/mL injection, take 0.5 mL that is 50 mcg     subcutaneously twice daily for diarrhea. 6. Zofran 4 mg tablet, take 1 tablet every 4 hours as needed for     nausea, vomiting. 7. Armour Thyroid 60 mg, take 2 tablets by mouth daily. 8. Biotin 5000 units, take 1 capsule by mouth daily. 9. Calcium over the counter 1 tablet by mouth daily. 10.Duloxetine 60 mg capsule (Cymbalta 1 tablet by mouth daily at     bedtime). 11.Lisinopril/hydrochlorothiazide 10/12.5 mg 1 tablet by mouth daily. 12.Naltrexone 3 mg 1 capsule by mouth daily at bedtime. 13.Progesterone in oil 200 mg oral capsule 1 capsule by mouth daily at      bedtime. 14.Vitamin B12 1000 mcg 1 injection intramuscularly twice a month. 15.Vitamin D2 5000 units 1 tablet by mouth daily.  DISCHARGE AND FOLLOWUP:  Erica Wheeler is to be discharged in a relatively stable condition at her baseline status at home.  She is to be followed up with Dr. Elnoria Howard in 2 weeks from the time of discharge.  Dr. Elnoria Howard is following her for many years now for her GI problems.  Also, we did try to make an appointment with [facility] for further management options.  The medical records from [facility] will fax all papers of hospital admission records, discharge summary, and Dr. Haywood Pao consultation note to [facility] and then they will contact the patient with appointment date and time.  PROCEDURES PERFORMED:  Acute abdominal series April 02, 2011. Impression, no acute abdominal or pulmonary abnormality.  CONSULTATIONS:  GI by Dr. Elnoria Howard who knows the patient very well.  CHIEF COMPLAINT:  Muscle aches/diarrhea.  HISTORY OF PRESENT ILLNESS:  Erica Wheeler is a 60 year old Caucasian woman who comes to the ED due to the chief complaint of upper arm and leg  muscle cramps getting worse since the morning of admission.  She has muscle cramps every day, which is episodic and does not get worse with any movements.  She is tearful due to the cramps.  She complains of diarrhea, watery stool 10-15 times a day, which is going on for some 6 years since she had total abdominal colectomy in around 2006 at [facility]. She tries to keep up with the fluid loss.  She also complains of left lower quadrant abdominal pain since 3 days.  She got Flagyl and Cipro from her PCP yesterday.  PHYSICAL EXAMINATION:  VITAL SIGNS ON ADMISSION:  Temperature 97.6, blood pressure 134/68, pulse 88, respirations 20, O2 sat 96% on room. GENERAL:  NAD.  Eyes, PERRLA/EOMI.  Anicteric.  ENT:  Dry mucous membranes.  No erythema.  No exudates. NECK:  Supple.  No JVD. RESPIRATIONS:  Clear to auscultations bilaterally.  No  wheezes, crackles, or rales. CARDIOVASCULAR:  S1, S2.  RRR. GI:  Soft, mild tenderness to palpation on left lower quadrant and left upper quadrant.  No guarding.  No rigidity. EXTREMITIES:  No edema.  Tenderness to palpation on upper arm and thighs and legs. SKIN:  No rash. MUSCULOSKELETAL:  Moving all four extremities. NEURO:  Alert and oriented x3.  Motor strength normal.  Sensation is normal. PSYCH:  Appropriate.  LABORATORY DATA ON ADMISSION:  Total CK 155.  I-STAT Chem:  Sodium 140, potassium 3.7, chloride 108, bicarb 20, BUN 14, creatinine 0.6, glucose 105.  Hemoglobin and 12.9, hematocrit 38.0.  HOSPITAL COURSE BY PROBLEM: 1. Myalgias and muscle cramps.  Erica Wheeler reported worsening of her     myalgia and muscle cramps since the morning of admission and was     crying due to pain at the time of admission.  She was started on IV     morphine and IV Valium every 4 hours as needed to help the cramps,     which made her more comfortable.  Her myalgias were worked up by     checking ferritin, vitamin B12 levels, electrolytes which all came     back within normal limits except vitamin B12 was high, which was     expected due to her getting replacement twice a month.  There was     no cause found during this admission for myalgia and so likely     diagnosis was either fibromyalgia worsening versus dehydration.     She was adequately hydrated during the admission and Cymbalta was     continued for her fibromyalgia.  She was transitioned to oral     morphine and Valium during the hospital course, and she tolerated     it well, and so will be discharged on oral medications at home. Of note- this problem has also been worked up extensively during last admission in 2011 and no definite cause was found. 2. Chronic diarrhea due to total abdominal colectomy and     ileoproctostomy at [facility] in 2006.  She is followed by Dr. Elnoria Howard of GI     for a long time, and he knows her very well.  Dr. Elnoria Howard  was     consulted during the admission, and he followed the patient and     gave his recommendations.  She was started on octreotide 100 mcg IV 3     tablets a day along with cholestyramine 2 g po twice a day and Lomotil     2 tablets every 8 hours to control  her diarrhea.  She did not     tolerate the octreotide well, and so the dose of octreotide was     decreased to 100 mcg daily IV during the admission, which was given     along with the Zofran to make it more tolerable.  She will be sent     home on 50 mcg twice a day of octreotide along with cholestyramine     2 g twice a day and Lomotil 2 tablets 3 times a day to control her     diarrhea.  She is to be followed up by Dr. Elnoria Howard in 2 weeks in his     office.  Also, we tried to make an arrangement for her to get     evaluated at [facility] for further management options.  The medical     records at [facility] will send the records from here to the [facility]     as [facility] asked for those records, and then [facility] will call Ms. Hippe     with appointment date and time.  Her diarrhea were well controlled     with octreotide, and so we will send her home on that.  I also     advised her to keep up with the fluid loss and drink as much food     as she can. 3. Left lower and upper quadrant abdominal pain, was found to be     likely due to peristaltic movements with diarrhea.  She did not     have any fever or white count elevation, and there was no clinical     reason to believe her having any proctitis or any inflammation or     infection of the bowels, so we held the antibiotics, which was     prescribed by her primary care as an outpatient.  She did not     complain of worsening abdominal pain during the hospital stay and     so we will not send her home on any antibiotics. 4. Hypertension.  Her blood pressure was relatively well controlled     during the hospital stay.  She was continued on her home dose of     lisinopril and  hydrochlorothiazide.  DISCHARGE LABORATORY DATA:  BMP, sodium 141, potassium 4.3, chloride 107, bicarb 29, BUN 9, creatinine 0.54, glucose 96, calcium 9.2, GFR more than 60.  DISCHARGE VITAL SIGNS:  Temperature 97.3, blood pressure 109/54, respirations 20, pulse 66, O2 sat 94% on room air.  She is to be discharged in relatively stable condition to home and is to be followed up by Dr. Elnoria Howard in 2 weeks as an outpatient.    ______________________________ Lyn Hollingshead, MD   ______________________________ Blanch Media, M.D.    RP/MEDQ  D:  04/05/2011  T:  04/06/2011  Job:  [id number]  Electronically Signed by Lyn Hollingshead MD on 04/15/2011 11:33:37 AM Electronically Signed by Blanch Media M.D. on 04/17/2011 02:19:17 PM

## 2011-05-20 ENCOUNTER — Telehealth: Payer: Self-pay | Admitting: Nurse Practitioner

## 2011-05-20 NOTE — Telephone Encounter (Signed)
Received mail today from [employer], placed on [employer] Desk for pickup this Tuesday

## 2011-12-25 ENCOUNTER — Encounter: Payer: Self-pay | Admitting: *Deleted

## 2012-05-06 ENCOUNTER — Other Ambulatory Visit: Payer: Self-pay | Admitting: Cardiology

## 2012-05-06 DIAGNOSIS — I714 Abdominal aortic aneurysm, without rupture: Secondary | ICD-10-CM

## 2012-05-12 ENCOUNTER — Other Ambulatory Visit: Payer: Self-pay | Admitting: Cardiology

## 2012-05-12 ENCOUNTER — Ambulatory Visit
Admission: RE | Admit: 2012-05-12 | Discharge: 2012-05-12 | Disposition: A | Discharge status: Home / Self Care | Source: Ambulatory Visit | Attending: Cardiology | Admitting: Cardiology

## 2012-05-12 ENCOUNTER — Other Ambulatory Visit

## 2012-05-12 DIAGNOSIS — R079 Chest pain, unspecified: Secondary | ICD-10-CM

## 2012-05-12 DIAGNOSIS — R0602 Shortness of breath: Secondary | ICD-10-CM

## 2012-05-12 DIAGNOSIS — I714 Abdominal aortic aneurysm, without rupture: Secondary | ICD-10-CM

## 2012-06-18 ENCOUNTER — Other Ambulatory Visit: Payer: Self-pay | Admitting: Nurse Practitioner

## 2012-06-18 DIAGNOSIS — E039 Hypothyroidism, unspecified: Secondary | ICD-10-CM

## 2012-06-24 ENCOUNTER — Ambulatory Visit
Admission: RE | Admit: 2012-06-24 | Discharge: 2012-06-24 | Disposition: A | Discharge status: Home / Self Care | Source: Ambulatory Visit | Attending: Nurse Practitioner | Admitting: Nurse Practitioner

## 2012-06-24 DIAGNOSIS — E039 Hypothyroidism, unspecified: Secondary | ICD-10-CM

## 2013-01-22 ENCOUNTER — Encounter: Payer: Self-pay | Admitting: Cardiology

## 2013-02-27 ENCOUNTER — Emergency Department (HOSPITAL_COMMUNITY)

## 2013-02-27 ENCOUNTER — Encounter (HOSPITAL_COMMUNITY): Payer: Self-pay | Admitting: *Deleted

## 2013-02-27 ENCOUNTER — Inpatient Hospital Stay (HOSPITAL_COMMUNITY)
Admission: EM | Admit: 2013-02-27 | Discharge: 2013-03-02 | DRG: 389 | Disposition: A | Discharge status: Home / Self Care | Attending: Internal Medicine | Admitting: Internal Medicine

## 2013-02-27 DIAGNOSIS — Z791 Long term (current) use of non-steroidal anti-inflammatories (NSAID): Secondary | ICD-10-CM

## 2013-02-27 DIAGNOSIS — R112 Nausea with vomiting, unspecified: Secondary | ICD-10-CM | POA: Diagnosis present

## 2013-02-27 DIAGNOSIS — Z880 Allergy status to penicillin: Secondary | ICD-10-CM

## 2013-02-27 DIAGNOSIS — Z882 Allergy status to sulfonamides status: Secondary | ICD-10-CM

## 2013-02-27 DIAGNOSIS — R1013 Epigastric pain: Secondary | ICD-10-CM | POA: Diagnosis present

## 2013-02-27 DIAGNOSIS — Z91018 Allergy to other foods: Secondary | ICD-10-CM

## 2013-02-27 DIAGNOSIS — E039 Hypothyroidism, unspecified: Secondary | ICD-10-CM | POA: Diagnosis present

## 2013-02-27 DIAGNOSIS — E063 Autoimmune thyroiditis: Secondary | ICD-10-CM | POA: Diagnosis present

## 2013-02-27 DIAGNOSIS — K56609 Unspecified intestinal obstruction, unspecified as to partial versus complete obstruction: Secondary | ICD-10-CM

## 2013-02-27 DIAGNOSIS — D8989 Other specified disorders involving the immune mechanism, not elsewhere classified: Secondary | ICD-10-CM | POA: Diagnosis present

## 2013-02-27 DIAGNOSIS — I10 Essential (primary) hypertension: Secondary | ICD-10-CM | POA: Diagnosis present

## 2013-02-27 DIAGNOSIS — M1710 Unilateral primary osteoarthritis, unspecified knee: Secondary | ICD-10-CM | POA: Diagnosis present

## 2013-02-27 DIAGNOSIS — Z800 Family history of malignant neoplasm of digestive organs: Secondary | ICD-10-CM

## 2013-02-27 DIAGNOSIS — R0602 Shortness of breath: Secondary | ICD-10-CM | POA: Diagnosis present

## 2013-02-27 DIAGNOSIS — Z885 Allergy status to narcotic agent status: Secondary | ICD-10-CM

## 2013-02-27 DIAGNOSIS — Z79899 Other long term (current) drug therapy: Secondary | ICD-10-CM

## 2013-02-27 DIAGNOSIS — Y838 Other surgical procedures as the cause of abnormal reaction of the patient, or of later complication, without mention of misadventure at the time of the procedure: Secondary | ICD-10-CM | POA: Diagnosis present

## 2013-02-27 DIAGNOSIS — Q211 Atrial septal defect: Secondary | ICD-10-CM

## 2013-02-27 DIAGNOSIS — Z8249 Family history of ischemic heart disease and other diseases of the circulatory system: Secondary | ICD-10-CM

## 2013-02-27 DIAGNOSIS — Z87820 Personal history of traumatic brain injury: Secondary | ICD-10-CM

## 2013-02-27 DIAGNOSIS — M359 Systemic involvement of connective tissue, unspecified: Secondary | ICD-10-CM | POA: Diagnosis present

## 2013-02-27 DIAGNOSIS — Z8261 Family history of arthritis: Secondary | ICD-10-CM

## 2013-02-27 DIAGNOSIS — Z9089 Acquired absence of other organs: Secondary | ICD-10-CM

## 2013-02-27 DIAGNOSIS — I519 Heart disease, unspecified: Secondary | ICD-10-CM | POA: Diagnosis present

## 2013-02-27 DIAGNOSIS — Z90710 Acquired absence of both cervix and uterus: Secondary | ICD-10-CM

## 2013-02-27 DIAGNOSIS — M3500 Sicca syndrome, unspecified: Secondary | ICD-10-CM | POA: Diagnosis present

## 2013-02-27 DIAGNOSIS — K565 Intestinal adhesions [bands], unspecified as to partial versus complete obstruction: Principal | ICD-10-CM | POA: Diagnosis present

## 2013-02-27 DIAGNOSIS — T8189XA Other complications of procedures, not elsewhere classified, initial encounter: Secondary | ICD-10-CM | POA: Diagnosis present

## 2013-02-27 DIAGNOSIS — K900 Celiac disease: Secondary | ICD-10-CM | POA: Diagnosis present

## 2013-02-27 DIAGNOSIS — Q2111 Secundum atrial septal defect: Secondary | ICD-10-CM

## 2013-02-27 DIAGNOSIS — E669 Obesity, unspecified: Secondary | ICD-10-CM | POA: Diagnosis present

## 2013-02-27 DIAGNOSIS — G2581 Restless legs syndrome: Secondary | ICD-10-CM | POA: Diagnosis present

## 2013-02-27 LAB — BASIC METABOLIC PANEL
CO2: 28 mEq/L (ref 19–32)
Calcium: 10 mg/dL (ref 8.4–10.5)
Creatinine, Ser: 0.8 mg/dL (ref 0.50–1.10)
Glucose, Bld: 109 mg/dL — ABNORMAL HIGH (ref 70–99)

## 2013-02-27 LAB — CBC
Hemoglobin: 14.6 g/dL (ref 12.0–15.0)
MCH: 29.3 pg (ref 26.0–34.0)
MCHC: 35.6 g/dL (ref 30.0–36.0)
MCV: 82.2 fL (ref 78.0–100.0)
RBC: 4.99 MIL/uL (ref 3.87–5.11)

## 2013-02-27 MED ORDER — MORPHINE SULFATE 4 MG/ML IJ SOLN
4.0000 mg | Freq: Once | INTRAMUSCULAR | Status: AC
  Administered 2013-02-27: 4 mg via INTRAVENOUS
  Filled 2013-02-27: qty 1

## 2013-02-27 MED ORDER — ONDANSETRON HCL 4 MG/2ML IJ SOLN
4.0000 mg | Freq: Once | INTRAMUSCULAR | Status: AC
  Administered 2013-02-27: 4 mg via INTRAVENOUS
  Filled 2013-02-27: qty 2

## 2013-02-27 MED ORDER — MORPHINE SULFATE 4 MG/ML IJ SOLN
4.0000 mg | Freq: Once | INTRAMUSCULAR | Status: AC
  Administered 2013-02-28: 4 mg via INTRAVENOUS
  Filled 2013-02-27: qty 1

## 2013-02-27 NOTE — ED Provider Notes (Signed)
History     CSN: [id number]  Arrival date & time 02/27/13  2139   First MD Initiated Contact with Patient 02/27/13 2251      Chief Complaint  Patient presents with  . Chest Pain   history of present illness complains of chest pain and subxiphoid area onset 2 AM today. Pain is constant not made worse with exertion not made worse with eating she treated herself with aspirin Maisie Fus, without relief. Associated symptoms include nausea shortness of breath no nausea or vomiting no other complaint pain is moderate to severe nothing makes symptoms better or worse  (Consider location/radiation/quality/duration/timing/severity/associated sxs/prior treatment) Patient is a 62 y.o. female presenting with chest pain.  Chest Pain Associated symptoms: shortness of breath     Past Medical History  Diagnosis Date  . History of TIAs     previous TIA's, she continues to report  some recurrent right sided facial tingling     . 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 Hypothyroidism     on replacement  . Osteoarthritis     of the right knee  . Obesity   . Syncope   . Hypertension   . Restless leg   . PFO with atrial septal aneurysm   . Numbness and tingling of right face     pt stated a fuzzy sensation / a long with a feeling of pressure  in her head  . Heart palpitations   . Chest pressure   . Right sided weakness   . Numbness of tongue     along with slurred speach -- MRI was negative      . Hashimoto's thyroiditis   . 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 Sjogren's syndrome   . Celiac sprue    Patent foramen ovale Past Surgical History  Procedure Laterality Date  . Appendectomy    . Cervical spine surgery  1997    fusion  . Colon resection    . Asd repair, secundum  2010  . Abdominal hysterectomy  1982    Family History  Problem Relation Age of Onset  . Stomach cancer Father   . Lung cancer Mother   . Arthritis Mother   .  Heart attack Maternal Grandfather     massive  . Heart disease Maternal Grandmother   . Hypertension Maternal Grandmother     History  Substance Use Topics  . Smoking status: Not on file  . Smokeless tobacco: Not on file  . Alcohol Use: Not on file   No tobacco rare alcohol no drug use OB History   Grav Para Term Preterm Abortions TAB SAB Ect Mult Living                  Review of Systems  Constitutional: Negative.   HENT: Negative.   Respiratory: Positive for shortness of breath.   Cardiovascular: Positive for chest pain.  Gastrointestinal: Positive for diarrhea.       Chronic diarrhea  Musculoskeletal: Negative.   Skin: Negative.   Neurological: Negative.   Psychiatric/Behavioral: Negative.   All other systems reviewed and are negative.    Allergies  Codeine; Oxycodone; Penicillins; and Sulfa antibiotics  Home Medications   Current Outpatient Rx  Name  Route  Sig  Dispense  Refill  . aspirin 325 MG tablet   Oral   Take 325 mg by mouth every 6 (six) hours as needed for pain.         Marland Kitchen  calcium carbonate (TUMS - DOSED IN MG ELEMENTAL CALCIUM) 500 MG chewable tablet   Oral   Chew 1 tablet by mouth every 4 (four) hours as needed for heartburn.         . Cyanocobalamin (VITAMIN B-12 IJ)   Injection   Inject 1,000 mg as directed every 30 (thirty) days.         Marland Kitchen DHEA 10 MG TABS   Oral   Take 10 mg by mouth daily.         . DULoxetine (CYMBALTA) 60 MG capsule   Oral   Take 60 mg by mouth daily.         . Levothyroxine Sodium (TIROSINT) 75 MCG CAPS   Oral   Take 75 mcg by mouth daily before breakfast.         . liothyronine (CYTOMEL) 5 MCG tablet   Oral   Take 5 mcg by mouth daily.         . naproxen sodium (ANAPROX) 220 MG tablet   Oral   Take 440 mg by mouth 2 (two) times daily as needed (for pain).         . Vitamin D, Ergocalciferol, (DRISDOL) 50000 UNITS CAPS   Oral   Take 50,000 Units by mouth every 7 (seven) days.          Marland Kitchen zolpidem (AMBIEN) 10 MG tablet   Oral   Take 10 mg by mouth at bedtime as needed for sleep.           BP 121/60  Pulse 68  Temp(Src) 98.1 F (36.7 C) (Oral)  Resp 20  SpO2 99%  Physical Exam  Nursing note and vitals reviewed. Constitutional: She appears well-developed and well-nourished.  HENT:  Head: Normocephalic and atraumatic.  Eyes: Conjunctivae are normal. Pupils are equal, round, and reactive to light.  Neck: Neck supple. No tracheal deviation present. No thyromegaly present.  Cardiovascular: Normal rate and regular rhythm.   No murmur heard. Pulmonary/Chest: Effort normal and breath sounds normal.  Abdominal: Soft. Bowel sounds are normal. She exhibits no distension. There is tenderness.  Epigastric tenderness, exquisite  Musculoskeletal: Normal range of motion. She exhibits no edema and no tenderness.  Neurological: She is alert. Coordination normal.  Skin: Skin is warm and dry. No rash noted.  Psychiatric: She has a normal mood and affect.    ED Course  Procedures (including critical care time)  Labs Reviewed  BASIC METABOLIC PANEL - Abnormal; Notable for the following:    Glucose, Bld 109 (*)    GFR calc non Af Amer 77 (*)    GFR calc Af Amer 90 (*)    All other components within normal limits  CBC  PRO B NATRIURETIC PEPTIDE  POCT I-STAT TROPONIN I   Dg Chest Port 1 View  02/27/2013   *RADIOLOGY REPORT*  Clinical Data: Chest pain and SOB  PORTABLE CHEST - 1 VIEW  Comparison: 05/12/2012  Findings: The heart size and mediastinal contours are within normal limits.  Both lungs are clear.  The visualized skeletal structures are unremarkable.  IMPRESSION: Negative exam.   Original Report Authenticated By: Signa Kell, M.D.     No diagnosis found.  Date: 02/27/2013  Rate: 75  Rhythm: normal sinus rhythm  QRS Axis: normal  Intervals: normal  ST/T Wave abnormalities: normal  Conduction Disutrbances: none  Narrative Interpretation:  unremarkable  Unchanged from 04/02/2011 interpreted by me Chest x-ray viewed by me  12:15 AM pain is improved then under  control after treatment with intravenous morphine. Patient exhibiting no nausea of treatment with intravenous Zofran. Signed out to Dr.Palumbo at 12:15 AM. Medical decision making: Doubt acute coronary syndrome with highly atypical symptoms symptoms nonexertional, normal EKG after prolonged. Patient is exhibiting abdominal, epigastric tenderness  Results for orders placed during the hospital encounter of 02/27/13  CBC      Result Value Range   WBC 6.7  4.0 - 10.5 K/uL   RBC 4.99  3.87 - 5.11 MIL/uL   Hemoglobin 14.6  12.0 - 15.0 g/dL   HCT 16.1  09.6 - 04.5 %   MCV 82.2  78.0 - 100.0 fL   MCH 29.3  26.0 - 34.0 pg   MCHC 35.6  30.0 - 36.0 g/dL   RDW 40.9  81.1 - 91.4 %   Platelets 245  150 - 400 K/uL  BASIC METABOLIC PANEL      Result Value Range   Sodium 141  135 - 145 mEq/L   Potassium 4.0  3.5 - 5.1 mEq/L   Chloride 102  96 - 112 mEq/L   CO2 28  19 - 32 mEq/L   Glucose, Bld 109 (*) 70 - 99 mg/dL   BUN 13  6 - 23 mg/dL   Creatinine, Ser 7.82  0.50 - 1.10 mg/dL   Calcium 95.6  8.4 - 21.3 mg/dL   GFR calc non Af Amer 77 (*) >90 mL/min   GFR calc Af Amer 90 (*) >90 mL/min  PRO B NATRIURETIC PEPTIDE      Result Value Range   Pro B Natriuretic peptide (BNP) 44.1  0 - 125 pg/mL  COMPREHENSIVE METABOLIC PANEL      Result Value Range   Sodium 140  135 - 145 mEq/L   Potassium 4.3  3.5 - 5.1 mEq/L   Chloride 102  96 - 112 mEq/L   CO2 31  19 - 32 mEq/L   Glucose, Bld 102 (*) 70 - 99 mg/dL   BUN 14  6 - 23 mg/dL   Creatinine, Ser 0.86  0.50 - 1.10 mg/dL   Calcium 57.8  8.4 - 46.9 mg/dL   Total Protein 6.6  6.0 - 8.3 g/dL   Albumin 3.6  3.5 - 5.2 g/dL   AST 22  0 - 37 U/L   ALT 17  0 - 35 U/L   Alkaline Phosphatase 75  39 - 117 U/L   Total Bilirubin 0.3  0.3 - 1.2 mg/dL   GFR calc non Af Amer 74 (*) >90 mL/min   GFR calc Af Amer 86 (*) >90 mL/min  LIPASE,  BLOOD      Result Value Range   Lipase 29  11 - 59 U/L  POCT I-STAT TROPONIN I      Result Value Range   Troponin i, poc 0.00  0.00 - 0.08 ng/mL   Comment 3           POCT I-STAT TROPONIN I      Result Value Range   Troponin i, poc 0.00  0.00 - 0.08 ng/mL   Comment 3            Dg Chest Port 1 View  02/27/2013   *RADIOLOGY REPORT*  Clinical Data: Chest pain and SOB  PORTABLE CHEST - 1 VIEW  Comparison: 05/12/2012  Findings: The heart size and mediastinal contours are within normal limits.  Both lungs are clear.  The visualized skeletal structures are unremarkable.  IMPRESSION: Negative exam.   Original Report  Authenticated By: Signa Kell, M.D.    Dr. Clearence Ped to make final disposition . CT scan of abdomen and other lab work pending Diagnosis epigastric pain   Doug Sou, MD 02/28/13 1610

## 2013-02-27 NOTE — ED Notes (Signed)
Pt c/o substernal CP since Saturday morning at 0200. Pt states she took 81mg  ASA x 4 at 1600. Pain relived by nothing, pain exacerbated by nothing.

## 2013-02-28 ENCOUNTER — Encounter (HOSPITAL_COMMUNITY): Payer: Self-pay | Admitting: Radiology

## 2013-02-28 ENCOUNTER — Emergency Department (HOSPITAL_COMMUNITY)

## 2013-02-28 DIAGNOSIS — R112 Nausea with vomiting, unspecified: Secondary | ICD-10-CM | POA: Diagnosis present

## 2013-02-28 DIAGNOSIS — M359 Systemic involvement of connective tissue, unspecified: Secondary | ICD-10-CM

## 2013-02-28 DIAGNOSIS — R1013 Epigastric pain: Secondary | ICD-10-CM | POA: Diagnosis present

## 2013-02-28 DIAGNOSIS — I10 Essential (primary) hypertension: Secondary | ICD-10-CM | POA: Diagnosis present

## 2013-02-28 DIAGNOSIS — D8989 Other specified disorders involving the immune mechanism, not elsewhere classified: Secondary | ICD-10-CM | POA: Diagnosis present

## 2013-02-28 DIAGNOSIS — K56609 Unspecified intestinal obstruction, unspecified as to partial versus complete obstruction: Secondary | ICD-10-CM

## 2013-02-28 DIAGNOSIS — E039 Hypothyroidism, unspecified: Secondary | ICD-10-CM | POA: Diagnosis present

## 2013-02-28 LAB — COMPREHENSIVE METABOLIC PANEL
Alkaline Phosphatase: 75 U/L (ref 39–117)
BUN: 14 mg/dL (ref 6–23)
CO2: 31 mEq/L (ref 19–32)
Chloride: 102 mEq/L (ref 96–112)
GFR calc Af Amer: 86 mL/min — ABNORMAL LOW (ref >90)
GFR calc non Af Amer: 74 mL/min — ABNORMAL LOW (ref >90)
Glucose, Bld: 102 mg/dL — ABNORMAL HIGH (ref 70–99)
Potassium: 4.3 mEq/L (ref 3.5–5.1)
Total Bilirubin: 0.3 mg/dL (ref 0.3–1.2)

## 2013-02-28 LAB — CBC
MCH: 28.9 pg (ref 26.0–34.0)
MCHC: 34.8 g/dL (ref 30.0–36.0)
MCV: 83.1 fL (ref 78.0–100.0)
Platelets: 203 10*3/uL (ref 150–400)
RBC: 4.78 MIL/uL (ref 3.87–5.11)
RDW: 12.8 % (ref 11.5–15.5)

## 2013-02-28 LAB — LIPASE, BLOOD: Lipase: 29 U/L (ref 11–59)

## 2013-02-28 LAB — BASIC METABOLIC PANEL
Calcium: 9.2 mg/dL (ref 8.4–10.5)
Creatinine, Ser: 0.8 mg/dL (ref 0.50–1.10)
GFR calc non Af Amer: 77 mL/min — ABNORMAL LOW (ref >90)
Glucose, Bld: 102 mg/dL — ABNORMAL HIGH (ref 70–99)
Sodium: 137 mEq/L (ref 135–145)

## 2013-02-28 MED ORDER — ACETAMINOPHEN 650 MG RE SUPP
650.0000 mg | Freq: Four times a day (QID) | RECTAL | Status: DC | PRN
  Administered 2013-03-01 – 2013-03-02 (×4): 650 mg via RECTAL
  Filled 2013-02-28 (×4): qty 1

## 2013-02-28 MED ORDER — PANTOPRAZOLE SODIUM 40 MG IV SOLR
40.0000 mg | INTRAVENOUS | Status: DC
  Administered 2013-02-28 – 2013-03-02 (×3): 40 mg via INTRAVENOUS
  Filled 2013-02-28 (×3): qty 40

## 2013-02-28 MED ORDER — PANTOPRAZOLE SODIUM 40 MG IV SOLR
40.0000 mg | Freq: Two times a day (BID) | INTRAVENOUS | Status: DC
  Filled 2013-02-28 (×2): qty 40

## 2013-02-28 MED ORDER — ONDANSETRON HCL 4 MG/2ML IJ SOLN
4.0000 mg | Freq: Four times a day (QID) | INTRAMUSCULAR | Status: DC | PRN
  Administered 2013-02-28 – 2013-03-02 (×6): 4 mg via INTRAVENOUS
  Filled 2013-02-28 (×6): qty 2

## 2013-02-28 MED ORDER — LIOTHYRONINE SODIUM 5 MCG PO TABS
5.0000 ug | ORAL_TABLET | Freq: Every day | ORAL | Status: DC
Start: 2013-02-28 — End: 2013-02-28
  Filled 2013-02-28: qty 1

## 2013-02-28 MED ORDER — LEVOTHYROXINE SODIUM 200 MCG IV SOLR
50.0000 ug | Freq: Every day | INTRAVENOUS | Status: DC
  Administered 2013-02-28 – 2013-03-01 (×2): 52 ug via INTRAVENOUS
  Administered 2013-03-02: 10:00:00 via INTRAVENOUS
  Filled 2013-02-28 (×3): qty 5

## 2013-02-28 MED ORDER — ONDANSETRON HCL 4 MG PO TABS: 4.0000 mg | ORAL_TABLET | Freq: Four times a day (QID) | ORAL | Status: DC | PRN

## 2013-02-28 MED ORDER — IOHEXOL 300 MG/ML  SOLN
20.0000 mL | INTRAMUSCULAR | Status: AC
Start: 2013-02-28 — End: 2013-02-28
  Administered 2013-02-28: 50 mL via ORAL

## 2013-02-28 MED ORDER — ZOLPIDEM TARTRATE 5 MG PO TABS: 5.0000 mg | ORAL_TABLET | Freq: Every evening | ORAL | Status: DC | PRN

## 2013-02-28 MED ORDER — DIPHENHYDRAMINE HCL 50 MG/ML IJ SOLN
12.5000 mg | Freq: Four times a day (QID) | INTRAMUSCULAR | Status: DC | PRN
  Administered 2013-02-28 – 2013-03-01 (×2): 12.5 mg via INTRAVENOUS
  Filled 2013-02-28 (×2): qty 1

## 2013-02-28 MED ORDER — LEVOTHYROXINE SODIUM 75 MCG PO CAPS: 75.0000 ug | ORAL_CAPSULE | Freq: Every day | ORAL | Status: DC

## 2013-02-28 MED ORDER — FENTANYL CITRATE 0.05 MG/ML IJ SOLN
100.0000 ug | Freq: Once | INTRAMUSCULAR | Status: AC
  Administered 2013-02-28: 100 ug via INTRAVENOUS
  Filled 2013-02-28: qty 2

## 2013-02-28 MED ORDER — IOHEXOL 300 MG/ML  SOLN
100.0000 mL | Freq: Once | INTRAMUSCULAR | Status: AC | PRN
  Administered 2013-02-28: 100 mL via INTRAVENOUS

## 2013-02-28 MED ORDER — KCL IN DEXTROSE-NACL 20-5-0.9 MEQ/L-%-% IV SOLN
INTRAVENOUS | Status: DC
  Administered 2013-02-28: 100 mL via INTRAVENOUS
  Administered 2013-02-28 – 2013-03-02 (×3): via INTRAVENOUS
  Filled 2013-02-28 (×7): qty 1000

## 2013-02-28 MED ORDER — LEVOTHYROXINE SODIUM 75 MCG PO TABS
75.0000 ug | ORAL_TABLET | Freq: Every day | ORAL | Status: DC
  Filled 2013-02-28: qty 1

## 2013-02-28 MED ORDER — SODIUM CHLORIDE 0.9 % IV SOLN
INTRAVENOUS | Status: DC
Start: 2013-02-28 — End: 2013-02-28
  Administered 2013-02-28: 06:00:00 via INTRAVENOUS

## 2013-02-28 MED ORDER — HYDROMORPHONE HCL PF 1 MG/ML IJ SOLN
0.5000 mg | INTRAMUSCULAR | Status: DC | PRN
  Administered 2013-02-28 (×5): 1 mg via INTRAVENOUS
  Filled 2013-02-28 (×5): qty 1

## 2013-02-28 MED ORDER — SODIUM CHLORIDE 0.9 % IJ SOLN
3.0000 mL | Freq: Two times a day (BID) | INTRAMUSCULAR | Status: DC
  Administered 2013-02-28 – 2013-03-01 (×3): 3 mL via INTRAVENOUS

## 2013-02-28 MED ORDER — ACETAMINOPHEN 325 MG PO TABS: 650.0000 mg | ORAL_TABLET | Freq: Four times a day (QID) | ORAL | Status: DC | PRN

## 2013-02-28 NOTE — H&P (Signed)
[facility] History and Physical  Erica Wheeler ZOX:[id number] DOB: [birth year] DOA: 02/27/2013  Referring physician: EDP PCP: No primary provider on file. Specialists:   Chief Complaint: Nausea Vomiting and ABD Pain  HPI: Erica Wheeler is a 62 y.o. female with multiple medical problems who presents to the ED with Epigastric ABD Pain and nausea and vomiting since 2 AM on 05/17.  She has not been able to hold down foods or liquids. A ct Scan in the ED was performed and revealed a SBO .   She was referrwed for medical admission.      Review of Systems: The patient denies anorexia, fever, chills, weight loss, vision loss, decreased hearing, hoarseness, syncope, dyspnea on exertion, peripheral edema, balance deficits, hemoptysis, hematemesis, melena, hematochezia, severe indigestion/heartburn, hematuria, incontinence, dysuria, muscle weakness, suspicious skin lesions, transient blindness, difficulty walking, depression, unusual weight change, abnormal bleeding, enlarged lymph nodes, angioedema, and breast masses.    Past Medical History  Diagnosis Date  . History of TIAs     previous TIA's, she continues to report  some recurrent right sided facial tingling     . 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 Hypothyroidism     on replacement  . Osteoarthritis     of the right knee  . Obesity   . Syncope   . Hypertension   . Restless leg   . PFO with atrial septal aneurysm   . Numbness and tingling of right face     pt stated a fuzzy sensation / a long with a feeling of pressure  in her head  . Heart palpitations   . Chest pressure   . Right sided weakness   . Numbness of tongue     along with slurred speach -- MRI was negative      . Hashimoto's thyroiditis   . 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 Sjogren's syndrome   . Celiac sprue     Past Surgical History  Procedure Laterality Date  . Appendectomy    . Cervical spine  surgery  1997    fusion  . Colon resection    . Asd repair, secundum  2010  . Abdominal hysterectomy  1982     Medications:  HOME MEDS: Prior to Admission medications   Medication Sig Start Date End Date Taking? Authorizing Provider  aspirin 325 MG tablet Take 325 mg by mouth every 6 (six) hours as needed for pain.   Yes Historical Provider, MD  calcium carbonate (TUMS - DOSED IN MG ELEMENTAL CALCIUM) 500 MG chewable tablet Chew 1 tablet by mouth every 4 (four) hours as needed for heartburn.   Yes Historical Provider, MD  Cyanocobalamin (VITAMIN B-12 IJ) Inject 1,000 mg as directed every 30 (thirty) days.   Yes Historical Provider, MD  DHEA 10 MG TABS Take 10 mg by mouth daily.   Yes Historical Provider, MD  DULoxetine (CYMBALTA) 60 MG capsule Take 60 mg by mouth daily.   Yes Historical Provider, MD  Levothyroxine Sodium (TIROSINT) 75 MCG CAPS Take 75 mcg by mouth daily before breakfast.   Yes Historical Provider, MD  liothyronine (CYTOMEL) 5 MCG tablet Take 5 mcg by mouth daily.   Yes Historical Provider, MD  naproxen sodium (ANAPROX) 220 MG tablet Take 440 mg by mouth 2 (two) times daily as needed (for pain).   Yes Historical Provider, MD  Vitamin D, Ergocalciferol, (DRISDOL) 50000  UNITS CAPS Take 50,000 Units by mouth every 7 (seven) days.   Yes Historical Provider, MD  zolpidem (AMBIEN) 10 MG tablet Take 10 mg by mouth at bedtime as needed for sleep.   Yes Historical Provider, MD    Allergies:  Allergies  Allergen Reactions  . Codeine Rash  . Oxycodone Rash  . Penicillins Rash  . Sulfa Antibiotics Rash    Social History: Married,   has no tobacco, alcohol, and drug history on file.   Family History: Family History  Problem Relation Age of Onset  . Stomach cancer Father   . Lung cancer Mother   . Arthritis Mother   . Heart attack Maternal Grandfather     massive  . Heart disease Maternal Grandmother   . Hypertension Maternal Grandmother      Physical  Exam:  GEN:  Pleasant 62 year old well nourished and well developed Caucasian Female examined  and in no acute distress; cooperative with exam Filed Vitals:   02/27/13 2315 02/28/13 0000 02/28/13 0004 02/28/13 0302  BP: 109/46  116/51 131/71  Pulse: 57 55  67  Temp:    97.9 F (36.6 C)  TempSrc:    Oral  Resp: 16 16  16   SpO2: 95% 97%  100%   Blood pressure 131/71, pulse 67, temperature 97.9 F (36.6 C), temperature source Oral, resp. rate 16, SpO2 100.00%. PSYCH: She is alert and oriented x4; does not appear anxious does not appear depressed; affect is normal HEENT: Normocephalic and Atraumatic, Mucous membranes pink; PERRLA; EOM intact; Fundi:  Benign;  No scleral icterus, Nares: Patent, Oropharynx: Clear, Fair Dentition, Neck:  FROM, no cervical lymphadenopathy nor thyromegaly or carotid bruit; no JVD; Breasts:: Not examined CHEST WALL: No tenderness CHEST: Normal respiration, clear to auscultation bilaterally HEART: Regular rate and rhythm; no murmurs rubs or gallops BACK: No kyphosis or scoliosis; no CVA tenderness ABDOMEN:  Decreased Bowel Sounds, soft non-tender; no masses, no organomegaly.    Rectal Exam: Not done EXTREMITIES: No cyanosis, clubbing or edema; no ulcerations. Genitalia: not examined PULSES: 2+ and symmetric SKIN: Normal hydration no rash or ulceration CNS: Cranial nerves 2-12 grossly intact no focal neurologic deficit   Labs & Imaging Results for orders placed during the hospital encounter of 02/27/13 (from the past 48 hour(s))  CBC     Status: None   Collection Time    02/27/13 10:00 PM      Result Value Range   WBC 6.7  4.0 - 10.5 K/uL   RBC 4.99  3.87 - 5.11 MIL/uL   Hemoglobin 14.6  12.0 - 15.0 g/dL   HCT 16.1  09.6 - 04.5 %   MCV 82.2  78.0 - 100.0 fL   MCH 29.3  26.0 - 34.0 pg   MCHC 35.6  30.0 - 36.0 g/dL   RDW 40.9  81.1 - 91.4 %   Platelets 245  150 - 400 K/uL  BASIC METABOLIC PANEL     Status: Abnormal   Collection Time    02/27/13 10:00  PM      Result Value Range   Sodium 141  135 - 145 mEq/L   Potassium 4.0  3.5 - 5.1 mEq/L   Chloride 102  96 - 112 mEq/L   CO2 28  19 - 32 mEq/L   Glucose, Bld 109 (*) 70 - 99 mg/dL   BUN 13  6 - 23 mg/dL   Creatinine, Ser 7.82  0.50 - 1.10 mg/dL   Calcium 10.0  8.4 - 10.5 mg/dL   GFR calc non Af Amer 77 (*) >90 mL/min   GFR calc Af Amer 90 (*) >90 mL/min   Comment:            The eGFR has been calculated     using the CKD EPI equation.     This calculation has not been     validated in all clinical     situations.     eGFR's persistently     <90 mL/min signify     possible Chronic Kidney Disease.  PRO B NATRIURETIC PEPTIDE     Status: None   Collection Time    02/27/13 10:01 PM      Result Value Range   Pro B Natriuretic peptide (BNP) 44.1  0 - 125 pg/mL  POCT I-STAT TROPONIN I     Status: None   Collection Time    02/27/13 10:17 PM      Result Value Range   Troponin i, poc 0.00  0.00 - 0.08 ng/mL   Comment 3            Comment: Due to the release kinetics of cTnI,     a negative result within the first hours     of the onset of symptoms does not rule out     myocardial infarction with certainty.     If myocardial infarction is still suspected,     repeat the test at appropriate intervals.  COMPREHENSIVE METABOLIC PANEL     Status: Abnormal   Collection Time    02/27/13 11:37 PM      Result Value Range   Sodium 140  135 - 145 mEq/L   Potassium 4.3  3.5 - 5.1 mEq/L   Chloride 102  96 - 112 mEq/L   CO2 31  19 - 32 mEq/L   Glucose, Bld 102 (*) 70 - 99 mg/dL   BUN 14  6 - 23 mg/dL   Creatinine, Ser 1.61  0.50 - 1.10 mg/dL   Calcium 09.6  8.4 - 04.5 mg/dL   Total Protein 6.6  6.0 - 8.3 g/dL   Albumin 3.6  3.5 - 5.2 g/dL   AST 22  0 - 37 U/L   ALT 17  0 - 35 U/L   Alkaline Phosphatase 75  39 - 117 U/L   Total Bilirubin 0.3  0.3 - 1.2 mg/dL   GFR calc non Af Amer 74 (*) >90 mL/min   GFR calc Af Amer 86 (*) >90 mL/min   Comment:            The eGFR has been  calculated     using the CKD EPI equation.     This calculation has not been     validated in all clinical     situations.     eGFR's persistently     <90 mL/min signify     possible Chronic Kidney Disease.  LIPASE, BLOOD     Status: None   Collection Time    02/27/13 11:37 PM      Result Value Range   Lipase 29  11 - 59 U/L  POCT I-STAT TROPONIN I     Status: None   Collection Time    02/27/13 11:52 PM      Result Value Range   Troponin i, poc 0.00  0.00 - 0.08 ng/mL   Comment 3            Comment: Due to the  release kinetics of cTnI,     a negative result within the first hours     of the onset of symptoms does not rule out     myocardial infarction with certainty.     If myocardial infarction is still suspected,     repeat the test at appropriate intervals.     Radiological Exams on Admission: Ct Abdomen Pelvis W Contrast  02/28/2013   *RADIOLOGY REPORT*  Clinical Data: Chest and abdominal pain.  CT ABDOMEN AND PELVIS WITH CONTRAST  Technique:  Multidetector CT imaging of the abdomen and pelvis was performed following the standard protocol during bolus administration of intravenous contrast.  Contrast: OMNIPAQUE IOHEXOL 300 MG/ML  SOLN  Comparison: Multiple exams, including 05/12/2012 and 07/07/2010  Findings: Stable left upper quadrant clips noted.  Mildly abnormally dilated loops of jejunum observed with associated abnormal jejunal wall thickening in several loops in the left abdomen.  These mildly dilated, thick-walled loops transition to normal caliber small bowel loops along the anterior abdominal wall with progressive dilution of the contrast column and transition point about on image 29 of series 400.  I do not observe an obvious twisting of the mesentery.  The liver, spleen, pancreas, and adrenal glands appear unremarkable.  The gallbladder and biliary system appear unremarkable.  The kidneys appear unremarkable, as do the proximal ureters.  No pathologic  retroperitoneal or porta hepatis adenopathy is identified.  No pathologic pelvic adenopathy is identified.  Most of the colon has been removed.  IMPRESSION:  1.  Dominant finding is mildly dilated and considerably thick- walled loops of small bowel in the left upper quadrant with a transition point and progressive dilution of the contrast column, suspicious for small bowel obstruction from adhesion and potentially regional small bowel inflammation.  I do not see obvious twisting of the mesentery to favor a closed loop obstruction. Some of the apparent wall thickening of the small bowel loops may be due to column of contrast mixing with other enteric contents.   Original Report Authenticated By: Gaylyn Rong, M.D.   Dg Chest Port 1 View  02/27/2013   *RADIOLOGY REPORT*  Clinical Data: Chest pain and SOB  PORTABLE CHEST - 1 VIEW  Comparison: 05/12/2012  Findings: The heart size and mediastinal contours are within normal limits.  Both lungs are clear.  The visualized skeletal structures are unremarkable.  IMPRESSION: Negative exam.   Original Report Authenticated By: Signa Kell, M.D.    EKG: Independently reviewed.   Assessment/Plan Principal Problem:   SBO (small bowel obstruction) Active Problems:   Epigastric abdominal pain   Nausea and vomiting   Hypertension   Hypothyroid   Autoimmune disorder   1.  SBO- Supportive Care, Anti-Emetics PRN, and IV Protonix, NPO, and General Surgery consultation this AM on 05/18.     2.  Epigastric ABD Pain-  Pain Control PRN and IV Protonix.    3.  Nausea and Vomiting- due to #1.    4.  Hypertension-  Monitor BPs, and PRN IV hydralazine for SBP > 160.    5.  Hypothyroid- on Levothyroxine and Cytomel  Check TSH.     6.  Autoimmune Disorders-  Sjoegren's, Hashimoto's Thyroiditis, Celiac Disease-  All stable at this time.      Code Status:    FULL CODE Family Communication:    Husband at Bedside Disposition Plan:     Return to Home on  Discharge  Time spent: 30 Minutes  Ron Parker [facility] Pager [phone]  If 7PM-7AM, please contact night-coverage [email or web address] Password [email or web address] 02/28/2013, 4:34 AM

## 2013-02-28 NOTE — Progress Notes (Signed)
[facility] Progress Note [place] TEAM 1 - Stepdown/ICU TEAM   Kristain Filo Rack ZOX:[id number] DOB: Feb 18, 1951 DOA: 02/27/2013 PCP: No primary provider on file.  Brief narrative: 62 y.o. female with multiple medical problems who presented to the ED with epigastric abdom pain with nausea and vomiting since 2 AM on 05/17. She had not been able to hold down foods or liquids. A CT in the ED revealed a SBO.  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 Elnoria Howard (GI)  Hypertension Reasonably well controlled at this time  Hypothyroid Cont replacement regimen - holding cytomel as pt npo - slightly increase synthroid via IV in absence of cytomel  PFO with atrial septal aneurysm  S/p closure   Sjogren's   Celiac disease  Code Status: FULL Family Communication: no family present at time of exam Disposition Plan: med bed   Consultants: none  Procedures: none  Antibiotics: none  DVT prophylaxis: lovenox  HPI/Subjective: Pt seen for a f/u visit   Objective: Blood pressure 148/82, pulse 54, temperature 97.6 F (36.4 C), temperature source Oral, resp. rate 18, height 5' 7.5" (1.715 m), weight 89.631 kg (197 lb 9.6 oz), SpO2 96.00%.  Intake/Output Summary (Last 24 hours) at 02/28/13 0912 Last data filed at 02/28/13 8119  Gross per 24 hour  Intake      0 ml  Output      0 ml  Net      0 ml   Exam: F/U exam completed  Data Reviewed: Basic Metabolic Panel:  Recent Labs Lab 02/27/13 2200 02/27/13 2337 02/28/13 0517  NA 141 140 137  K 4.0 4.3 4.1  CL 102 102 102  CO2 28 31 27   GLUCOSE 109* 102* 102*  BUN 13 14 12   CREATININE 0.80 0.83 0.80  CALCIUM 10.0 10.0 9.2   Liver Function Tests:  Recent Labs Lab 02/27/13 2337  AST 22  ALT 17  ALKPHOS 75  BILITOT 0.3  PROT 6.6  ALBUMIN 3.6     Recent Labs Lab 02/27/13 2337  LIPASE 29   CBC:  Recent Labs Lab 02/27/13 2200 02/28/13 0517  WBC 6.7 6.6  HGB 14.6 13.8  HCT 41.0 39.7  MCV 82.2 83.1  PLT 245 203   BNP (last 3 results)  Recent Labs  02/27/13 2201  PROBNP 44.1    Studies:  Recent x-ray studies have been reviewed in detail by the Attending Physician  Scheduled Meds:  Scheduled Meds: . Levothyroxine Sodium  52 mcg Intravenous Daily  . pantoprazole (PROTONIX) IV  40 mg Intravenous Q24H  . sodium chloride  3 mL Intravenous Q12H   Continuous Infusions: . dextrose 5 % and 0.9 % NaCl with KCl 20 mEq/L      Time spent on care of this patient: 25+ mins   [facility] T  [facility] Office  [phone] Pager - Text Page per Loretha Stapler as per below:  On-Call/Text Page:      [email or web address]      password [email or web address]  If 7PM-7AM, please contact night-coverage [email or web address] Password [email or web address] 02/28/2013, 9:12 AM   LOS: 1 day

## 2013-03-01 ENCOUNTER — Inpatient Hospital Stay (HOSPITAL_COMMUNITY)

## 2013-03-01 LAB — COMPREHENSIVE METABOLIC PANEL
ALT: 15 U/L (ref 0–35)
AST: 23 U/L (ref 0–37)
Albumin: 3.3 g/dL — ABNORMAL LOW (ref 3.5–5.2)
CO2: 24 mEq/L (ref 19–32)
Calcium: 8.3 mg/dL — ABNORMAL LOW (ref 8.4–10.5)
Creatinine, Ser: 0.7 mg/dL (ref 0.50–1.10)
Sodium: 140 mEq/L (ref 135–145)
Total Protein: 6.3 g/dL (ref 6.0–8.3)

## 2013-03-01 LAB — MAGNESIUM: Magnesium: 1.8 mg/dL (ref 1.5–2.5)

## 2013-03-01 LAB — CBC
HCT: 39.1 % (ref 36.0–46.0)
Hemoglobin: 13.2 g/dL (ref 12.0–15.0)
MCH: 28.3 pg (ref 26.0–34.0)
MCHC: 33.8 g/dL (ref 30.0–36.0)
MCV: 83.7 fL (ref 78.0–100.0)
RBC: 4.67 MIL/uL (ref 3.87–5.11)

## 2013-03-01 NOTE — Progress Notes (Signed)
[facility] PROGRESS NOTE  Erica Wheeler ZOX:[id number] DOB: Mar 16, 1951 DOA: 02/27/2013 PCP: No primary provider on file.  Brief narrative: 62 y.o. female with past medical history significant for but not limited to chronic diarrhea with total abdominal colectomy and ileoproctostomy in 2006, hypertension and hypothyroidism who presented to [facility] ED 02/27/2013 with ongoing epigastric abdominal pain associated with nausea and vomiting for one day prior to this admission. In ED, evaluation included abdominal x-ray which was unremarkable and CT abdomen which was significant for possible small bowel obstruction secondary to adhesions. CBC and BMP were unremarkable on the admission. Assessment/Plan:   Principal problem: *Abdominal pain - Likely related to small bowel obstruction secondary to adhesions - Patient is n.p.o. currently and receiving IV fluids. Her abdominal exam is unremarkable. - We will advance diet to as tolerated today and hopefully we can plan for discharge for tomorrow morning.  Active problems: Small bowel obstruction - Management as above. 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 Elnoria Howard (GI)  Hypertension  - Reasonably well controlled at this time; BP 131/56 Hypothyroidism - continue synthroid  Code Status: full code Family Communication: no family at the bedside Disposition Plan: home in am  Manson Passey, MD  [facility] Pager [phone]  If 7PM-7AM, please contact night-coverage [email or web address] Password [email or web address] 03/01/2013, 2:07 PM   LOS: 2 days   Consultants:  None   Procedures:  None   Antibiotics:  None   HPI/Subjective: No acute overnight events.  Objective: Filed Vitals:   02/28/13 2055 03/01/13 0541 03/01/13 0738 03/01/13 1325  BP: 123/62 134/53 134/60 131/56  Pulse: 54 65 63 58  Temp: 98 F (36.7 C) 98.2 F (36.8 C) 97.8 F (36.6 C) 98.2  F (36.8 C)  TempSrc: Oral Oral Oral Oral  Resp: 18 18 18 14   Height:      Weight:      SpO2: 97% 99% 100% 100%    Intake/Output Summary (Last 24 hours) at 03/01/13 1407 Last data filed at 03/01/13 0900  Gross per 24 hour  Intake      0 ml  Output   1602 ml  Net  -1602 ml    Exam:   General:  Pt is alert, follows commands appropriately, not in acute distress  Cardiovascular: Regular rate and rhythm, S1/S2, no murmurs, no rubs, no gallops  Respiratory: Clear to auscultation bilaterally, no wheezing, no crackles, no rhonchi  Abdomen: Soft, non tender, non distended, bowel sounds present, no guarding  Extremities: No edema, pulses DP and PT palpable bilaterally  Neuro: Grossly nonfocal  Data Reviewed: Basic Metabolic Panel:  Recent Labs Lab 02/27/13 2200 02/27/13 2337 02/28/13 0517 03/01/13 0515  NA 141 140 137 140  K 4.0 4.3 4.1 4.1  CL 102 102 102 106  CO2 28 31 27 24   GLUCOSE 109* 102* 102* 129*  BUN 13 14 12 6   CREATININE 0.80 0.83 0.80 0.70  CALCIUM 10.0 10.0 9.2 8.3*  MG  --   --   --  1.8  PHOS  --   --   --  2.7   Liver Function Tests:  Recent Labs Lab 02/27/13 2337 03/01/13 0515  AST 22 23  ALT 17 15  ALKPHOS 75 62  BILITOT 0.3 0.6  PROT 6.6 6.3  ALBUMIN 3.6 3.3*    Recent Labs Lab 02/27/13 2337  LIPASE 29   CBC:  Recent Labs  Lab 02/27/13 2200 02/28/13 0517 03/01/13 0515  WBC 6.7 6.6 5.8  HGB 14.6 13.8 13.2  HCT 41.0 39.7 39.1  MCV 82.2 83.1 83.7  PLT 245 203 197    Studies: Dg Abd 1 View 03/01/2013    IMPRESSION: No radiographic findings to confirm the diagnosis of small bowel obstruction.     Ct Abdomen Pelvis W Contrast 02/28/2013     IMPRESSION:  1.  Dominant finding is mildly dilated and considerably thick- walled loops of small bowel in the left upper quadrant with a transition point and progressive dilution of the contrast column, suspicious for small bowel obstruction from adhesion and potentially regional small  bowel inflammation.  I do not see obvious twisting of the mesentery to favor a closed loop obstruction. Some of the apparent wall thickening of the small bowel loops may be due to column of contrast mixing with other enteric contents.     Dg Chest Port 1 View 02/27/2013   * IMPRESSION: Negative exam.      Scheduled Meds: . Levothyroxine Sodium  52 mcg Intravenous Daily  . pantoprazole   40 mg Intravenous Q24H   Continuous Infusions: . dextrose 5 % and 0.9 % NaCl with KCl 20 mEq/L 100 mL/hr at 02/28/13 2148

## 2013-03-02 NOTE — Progress Notes (Signed)
Patient unable to advance her diet yesterday, remains NPO except ice chips.  Clear liquid breakfast ordered for today.  Will continue to monitor.  Erica Wheeler

## 2013-03-02 NOTE — Discharge Summary (Signed)
Physician Discharge Summary  Erica Wheeler JYN:[id number] DOB: 09/10/1951 DOA: 02/27/2013  PCP: No primary provider on file.  Admit date: 02/27/2013 Discharge date: 03/02/2013  Time spent: 35 minutes  Recommendations for Outpatient Follow-up:  1. Follow up with Dr. Elnoria Howard as an outpatient.  Discharge Diagnoses:  Principal Problem:   SBO (small bowel obstruction) Active Problems:   Epigastric abdominal pain   Nausea and vomiting   Hypertension   Hypothyroid   Autoimmune disorder   Discharge Condition: stable  Diet recommendation: heart healthy diet  Filed Weights   02/28/13 0543  Weight: 89.631 kg (197 lb 9.6 oz)    History of present illness:  62 y.o. female with multiple medical problems who presents to the ED with Epigastric ABD Pain and nausea and vomiting since 2 AM on 05/17. She has not been able to hold down foods or liquids. A ct Scan in the ED was performed and revealed a SBO . She was referrwed for medical admission.    Hospital Course:  Abdominal pain due to Small bowel obstruction  - Likely related to small bowel obstruction secondary to adhesions  - Patient was treated conservatively. Started passing gas an having BM diet was advance which she tolerate it. - electrolytes repleted.  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 Elnoria Howard (GI)   Hypertension  - Reasonably well controlled at this time; BP 131/56   Hypothyroidism  - continue synthroid   Procedures:  None  Consultations:  none  Discharge Exam: Filed Vitals:   03/01/13 1325 03/01/13 1710 03/01/13 2154 03/02/13 0611  BP: 131/56 116/52 134/55 119/58  Pulse: 58 57 58 60  Temp: 98.2 F (36.8 C) 98.8 F (37.1 C) 98.5 F (36.9 C) 98.7 F (37.1 C)  TempSrc: Oral Oral Oral Oral  Resp: 14 18 18 18   Height:      Weight:      SpO2: 100% 98% 99% 97%    General: A&O x 3 Cardiovascular: RRR  Discharge  Instructions  Discharge Orders   Future Orders Complete By Expires     Diet - low sodium heart healthy  As directed     Increase activity slowly  As directed         Medication List    TAKE these medications       aspirin 325 MG tablet  Take 325 mg by mouth every 6 (six) hours as needed for pain.     calcium carbonate 500 MG chewable tablet  Commonly known as:  TUMS - dosed in mg elemental calcium  Chew 1 tablet by mouth every 4 (four) hours as needed for heartburn.     DHEA 10 MG Tabs  Take 10 mg by mouth daily.     DULoxetine 60 MG capsule  Commonly known as:  CYMBALTA  Take 60 mg by mouth daily.     liothyronine 5 MCG tablet  Commonly known as:  CYTOMEL  Take 5 mcg by mouth daily.     naproxen sodium 220 MG tablet  Commonly known as:  ANAPROX  Take 440 mg by mouth 2 (two) times daily as needed (for pain).     TIROSINT 75 MCG Caps  Generic drug:  Levothyroxine Sodium  Take 75 mcg by mouth daily before breakfast.     VITAMIN B-12 IJ  Inject 1,000 mg as directed every 30 (thirty) days.     Vitamin D (Ergocalciferol) 50000  UNITS Caps  Commonly known as:  DRISDOL  Take 50,000 Units by mouth every 7 (seven) days.     zolpidem 10 MG tablet  Commonly known as:  AMBIEN  Take 10 mg by mouth at bedtime as needed for sleep.       Allergies  Allergen Reactions  . Gluten Meal   . Codeine Rash  . Oxycodone Rash  . Penicillins Rash  . Sulfa Antibiotics Rash       Follow-up Information   Follow up with HUNG,PATRICK D, MD In 2 weeks. (hospital follow up)    Contact information:   [address] [place] [address] [phone]        The results of significant diagnostics from this hospitalization (including imaging, microbiology, ancillary and laboratory) are listed below for reference.    Significant Diagnostic Studies: Dg Abd 1 View  03/01/2013   *RADIOLOGY REPORT*  Clinical Data: Left lower abdominal pain, prior small bowel resection,  follow up possible partial small bowel obstruction  ABDOMEN - 1 VIEW  Comparison: CT abdomen pelvis dated 02/28/2013  Findings: Nonspecific bowel gas pattern without disproportionate small bowel dilatation to suggest small bowel obstruction.  Visual contrast in the distal colon/rectum.  Surgical clips in the left upper abdomen.  Mild degenerative changes of the mid thoracic spine.  IMPRESSION: No radiographic findings to confirm the diagnosis of small bowel obstruction.   Original Report Authenticated By: Charline Bills, M.D.   Ct Abdomen Pelvis W Contrast  02/28/2013   *RADIOLOGY REPORT*  Clinical Data: Chest and abdominal pain.  CT ABDOMEN AND PELVIS WITH CONTRAST  Technique:  Multidetector CT imaging of the abdomen and pelvis was performed following the standard protocol during bolus administration of intravenous contrast.  Contrast: OMNIPAQUE IOHEXOL 300 MG/ML  SOLN  Comparison: Multiple exams, including 05/12/2012 and 07/07/2010  Findings: Stable left upper quadrant clips noted.  Mildly abnormally dilated loops of jejunum observed with associated abnormal jejunal wall thickening in several loops in the left abdomen.  These mildly dilated, thick-walled loops transition to normal caliber small bowel loops along the anterior abdominal wall with progressive dilution of the contrast column and transition point about on image 29 of series 400.  I do not observe an obvious twisting of the mesentery.  The liver, spleen, pancreas, and adrenal glands appear unremarkable.  The gallbladder and biliary system appear unremarkable.  The kidneys appear unremarkable, as do the proximal ureters.  No pathologic retroperitoneal or porta hepatis adenopathy is identified.  No pathologic pelvic adenopathy is identified.  Most of the colon has been removed.  IMPRESSION:  1.  Dominant finding is mildly dilated and considerably thick- walled loops of small bowel in the left upper quadrant with a transition point and  progressive dilution of the contrast column, suspicious for small bowel obstruction from adhesion and potentially regional small bowel inflammation.  I do not see obvious twisting of the mesentery to favor a closed loop obstruction. Some of the apparent wall thickening of the small bowel loops may be due to column of contrast mixing with other enteric contents.   Original Report Authenticated By: Gaylyn Rong, M.D.   Dg Chest Port 1 View  02/27/2013   *RADIOLOGY REPORT*  Clinical Data: Chest pain and SOB  PORTABLE CHEST - 1 VIEW  Comparison: 05/12/2012  Findings: The heart size and mediastinal contours are within normal limits.  Both lungs are clear.  The visualized skeletal structures are unremarkable.  IMPRESSION: Negative exam.   Original Report  Authenticated By: Signa Kell, M.D.    Microbiology: No results found for this or any previous visit (from the past 240 hour(s)).   Labs: Basic Metabolic Panel:  Recent Labs Lab 02/27/13 2200 02/27/13 2337 02/28/13 0517 03/01/13 0515  NA 141 140 137 140  K 4.0 4.3 4.1 4.1  CL 102 102 102 106  CO2 28 31 27 24   GLUCOSE 109* 102* 102* 129*  BUN 13 14 12 6   CREATININE 0.80 0.83 0.80 0.70  CALCIUM 10.0 10.0 9.2 8.3*  MG  --   --   --  1.8  PHOS  --   --   --  2.7   Liver Function Tests:  Recent Labs Lab 02/27/13 2337 03/01/13 0515  AST 22 23  ALT 17 15  ALKPHOS 75 62  BILITOT 0.3 0.6  PROT 6.6 6.3  ALBUMIN 3.6 3.3*    Recent Labs Lab 02/27/13 2337  LIPASE 29   No results found for this basename: AMMONIA,  in the last 168 hours CBC:  Recent Labs Lab 02/27/13 2200 02/28/13 0517 03/01/13 0515  WBC 6.7 6.6 5.8  HGB 14.6 13.8 13.2  HCT 41.0 39.7 39.1  MCV 82.2 83.1 83.7  PLT 245 203 197   Cardiac Enzymes: No results found for this basename: CKTOTAL, CKMB, CKMBINDEX, TROPONINI,  in the last 168 hours BNP: BNP (last 3 results)  Recent Labs  02/27/13 2201  PROBNP 44.1   CBG: No results found for this  basename: GLUCAP,  in the last 168 hours     Signed:  Marinda Elk  [facility] 03/02/2013, 8:59 AM

## 2013-03-02 NOTE — Progress Notes (Signed)
Pt ate lunch and dinner and tolerated it well. D/c'd to private vehicle with family

## 2013-04-14 ENCOUNTER — Other Ambulatory Visit: Payer: Self-pay | Admitting: Family Medicine

## 2013-04-14 DIAGNOSIS — R439 Unspecified disturbances of smell and taste: Secondary | ICD-10-CM

## 2013-04-15 ENCOUNTER — Ambulatory Visit
Admission: RE | Admit: 2013-04-15 | Discharge: 2013-04-15 | Disposition: A | Discharge status: Home / Self Care | Source: Ambulatory Visit | Attending: Family Medicine | Admitting: Family Medicine

## 2013-04-15 DIAGNOSIS — R439 Unspecified disturbances of smell and taste: Secondary | ICD-10-CM

## 2013-06-07 ENCOUNTER — Other Ambulatory Visit: Payer: Self-pay | Admitting: Gastroenterology

## 2013-06-07 DIAGNOSIS — R109 Unspecified abdominal pain: Secondary | ICD-10-CM

## 2013-06-07 DIAGNOSIS — R112 Nausea with vomiting, unspecified: Secondary | ICD-10-CM

## 2013-06-08 ENCOUNTER — Ambulatory Visit
Admission: RE | Admit: 2013-06-08 | Discharge: 2013-06-08 | Disposition: A | Discharge status: Home / Self Care | Source: Ambulatory Visit | Attending: Gastroenterology | Admitting: Gastroenterology

## 2013-06-08 DIAGNOSIS — R109 Unspecified abdominal pain: Secondary | ICD-10-CM

## 2013-06-08 DIAGNOSIS — R112 Nausea with vomiting, unspecified: Secondary | ICD-10-CM

## 2013-06-08 MED ORDER — IOHEXOL 300 MG/ML  SOLN
100.0000 mL | Freq: Once | INTRAMUSCULAR | Status: AC | PRN
  Administered 2013-06-08: 100 mL via INTRAVENOUS

## 2013-08-30 ENCOUNTER — Other Ambulatory Visit: Payer: Self-pay | Admitting: Gastroenterology

## 2013-08-30 DIAGNOSIS — R109 Unspecified abdominal pain: Secondary | ICD-10-CM

## 2013-08-30 DIAGNOSIS — R079 Chest pain, unspecified: Secondary | ICD-10-CM

## 2013-09-13 ENCOUNTER — Encounter (HOSPITAL_COMMUNITY)
Admission: RE | Disposition: A | Discharge status: Home / Self Care | Payer: Self-pay | Source: Ambulatory Visit | Attending: Gastroenterology

## 2013-09-13 ENCOUNTER — Ambulatory Visit (HOSPITAL_COMMUNITY)
Admission: RE | Admit: 2013-09-13 | Discharge: 2013-09-13 | Disposition: A | Discharge status: Home / Self Care | Payer: Medicare Other | Source: Ambulatory Visit | Attending: Gastroenterology | Admitting: Gastroenterology

## 2013-09-13 DIAGNOSIS — R1013 Epigastric pain: Secondary | ICD-10-CM | POA: Insufficient documentation

## 2013-09-13 DIAGNOSIS — K900 Celiac disease: Secondary | ICD-10-CM | POA: Insufficient documentation

## 2013-09-13 DIAGNOSIS — Z8673 Personal history of transient ischemic attack (TIA), and cerebral infarction without residual deficits: Secondary | ICD-10-CM | POA: Insufficient documentation

## 2013-09-13 DIAGNOSIS — R112 Nausea with vomiting, unspecified: Secondary | ICD-10-CM | POA: Insufficient documentation

## 2013-09-13 DIAGNOSIS — M3500 Sicca syndrome, unspecified: Secondary | ICD-10-CM | POA: Insufficient documentation

## 2013-09-13 DIAGNOSIS — K219 Gastro-esophageal reflux disease without esophagitis: Secondary | ICD-10-CM | POA: Insufficient documentation

## 2013-09-13 DIAGNOSIS — E039 Hypothyroidism, unspecified: Secondary | ICD-10-CM | POA: Insufficient documentation

## 2013-09-13 DIAGNOSIS — R079 Chest pain, unspecified: Secondary | ICD-10-CM | POA: Insufficient documentation

## 2013-09-13 DIAGNOSIS — E669 Obesity, unspecified: Secondary | ICD-10-CM | POA: Insufficient documentation

## 2013-09-13 DIAGNOSIS — I10 Essential (primary) hypertension: Secondary | ICD-10-CM | POA: Insufficient documentation

## 2013-09-13 DIAGNOSIS — K311 Adult hypertrophic pyloric stenosis: Secondary | ICD-10-CM | POA: Insufficient documentation

## 2013-09-13 DIAGNOSIS — R1310 Dysphagia, unspecified: Secondary | ICD-10-CM | POA: Insufficient documentation

## 2013-09-13 HISTORY — PX: ESOPHAGEAL MANOMETRY: SHX5429

## 2013-09-13 SURGERY — MANOMETRY, ESOPHAGUS
Anesthesia: Topical

## 2013-09-13 MED ORDER — LIDOCAINE VISCOUS 2 % MT SOLN
OROMUCOSAL | Status: AC
  Filled 2013-09-13: qty 15

## 2013-09-14 ENCOUNTER — Encounter (HOSPITAL_COMMUNITY): Payer: Self-pay | Admitting: Gastroenterology

## 2013-09-14 NOTE — H&P (Signed)
   Erica Wheeler HPI: This is a 62 year old female who is well-known to me with persistent epigastric pain, nausea, and vomiting.  She Has a history of SBO, but recent admission for this issue was negative for this issue.  Despite trials with PPIs and a GI cocktail there is no significant response.  She is also undergoing IVIg therapy with a Rheumatologist in [place], [place].  A manometry was ordered for further evaluation of her issues.  Past Medical History  Diagnosis Date  . History of TIAs     previous TIA's, she continues to report  some recurrent right sided facial tingling     . 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 Hypothyroidism     on replacement  . Osteoarthritis     of the right knee  . Obesity   . Syncope   . Hypertension   . Restless leg   . PFO with atrial septal aneurysm   . Numbness and tingling of right face     pt stated a fuzzy sensation / a long with a feeling of pressure  in her head  . Heart palpitations   . Chest pressure   . Right sided weakness   . Numbness of tongue     along with slurred speach -- MRI was negative      . Hashimoto's thyroiditis   . 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 Sjogren's syndrome   . Celiac sprue     Past Surgical History  Procedure Laterality Date  . Appendectomy    . Cervical spine surgery  1997    fusion  . Colon resection    . Asd repair, secundum  2010  . Abdominal hysterectomy  1982  . Meniscus repair Bilateral   . Esophageal manometry N/A 09/13/2013    Procedure: ESOPHAGEAL MANOMETRY (EM);  Surgeon: Theda Belfast, MD;  Location: [facility];  Service: Endoscopy;  Laterality: N/A;    Family History  Problem Relation Age of Onset  . Stomach cancer Father   . Lung cancer Mother   . Arthritis Mother   . Heart attack Maternal Grandfather     massive  . Heart disease Maternal Grandmother   . Hypertension Maternal Grandmother     Social History:  reports  that she has never smoked. She has never used smokeless tobacco. She reports that she drinks alcohol. Her drug history is not on file.  Allergies:  Allergies  Allergen Reactions  . Gluten Meal   . Codeine Rash  . Oxycodone Rash  . Penicillins Rash  . Sulfa Antibiotics Rash    Medications: Scheduled: Continuous:  No results found for this or any previous visit (from the past 24 hour(s)).   No results found.  ROS:  As stated above in the HPI otherwise negative.  There were no vitals taken for this visit.    PE: No performed.  Outpatient nursing procedure.  Assessment/Plan: 1) Nausea/Vomiting, GERD, and ABM pain.  Plan: 1) Manometry.  Chole Driver D 09/14/2013, 1:46 PM

## 2013-09-20 ENCOUNTER — Ambulatory Visit (HOSPITAL_COMMUNITY)
Admission: RE | Admit: 2013-09-20 | Discharge: 2013-09-20 | Disposition: A | Discharge status: Home / Self Care | Payer: Medicare Other | Source: Ambulatory Visit | Attending: Gastroenterology | Admitting: Gastroenterology

## 2013-09-20 ENCOUNTER — Encounter (HOSPITAL_COMMUNITY)
Admission: RE | Admit: 2013-09-20 | Discharge: 2013-09-20 | Disposition: A | Discharge status: Home / Self Care | Payer: Medicare Other | Source: Ambulatory Visit | Attending: Gastroenterology | Admitting: Gastroenterology

## 2013-09-20 DIAGNOSIS — R109 Unspecified abdominal pain: Secondary | ICD-10-CM

## 2013-09-20 DIAGNOSIS — R079 Chest pain, unspecified: Secondary | ICD-10-CM | POA: Insufficient documentation

## 2013-09-20 MED ORDER — TECHNETIUM TC 99M MEBROFENIN IV KIT
5.0000 | PACK | Freq: Once | INTRAVENOUS | Status: AC | PRN
  Administered 2013-09-20: 5 via INTRAVENOUS

## 2013-10-22 ENCOUNTER — Encounter (HOSPITAL_COMMUNITY): Payer: Self-pay | Admitting: Gastroenterology

## 2014-02-26 IMAGING — US US ABDOMEN COMPLETE
1 series · 14 of 25 positions shown · non-contrast
Comparison: CT abdomen and pelvis 06/08/2013.

CLINICAL DATA: Abdominal pain.

EXAM:
ULTRASOUND ABDOMEN COMPLETE

[Series 1: us abdomen complete · 0.22mm/px · 14 of 72 slices shown]
[im 1/72]
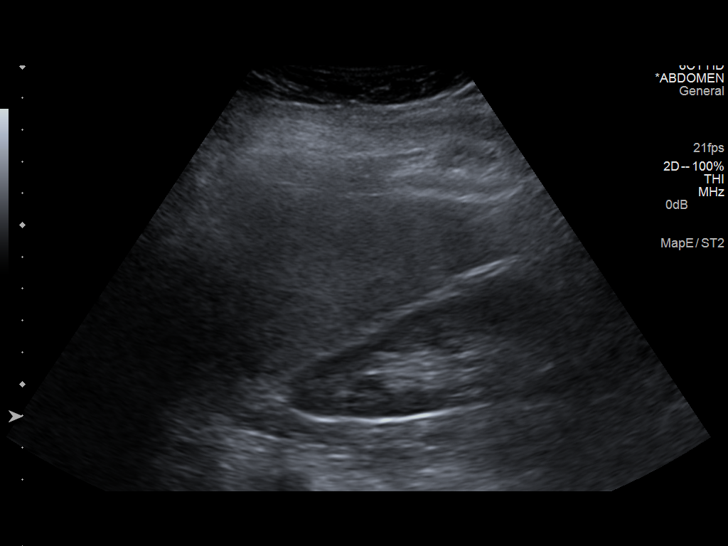
[im 6/72]
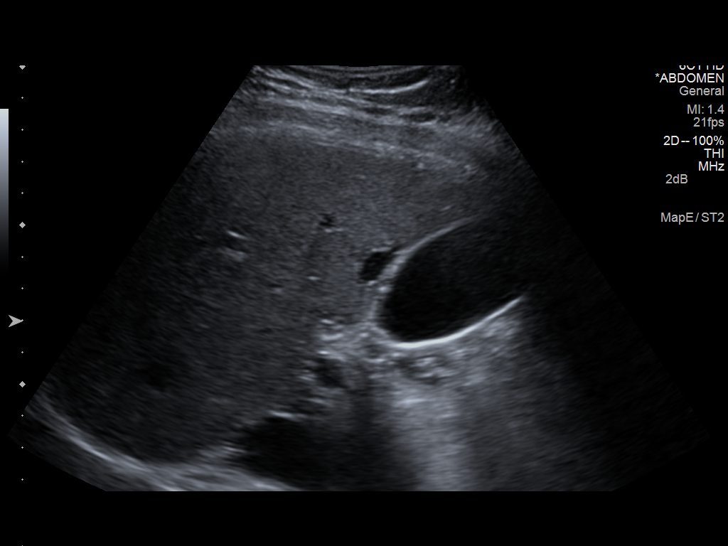
[im 12/72]
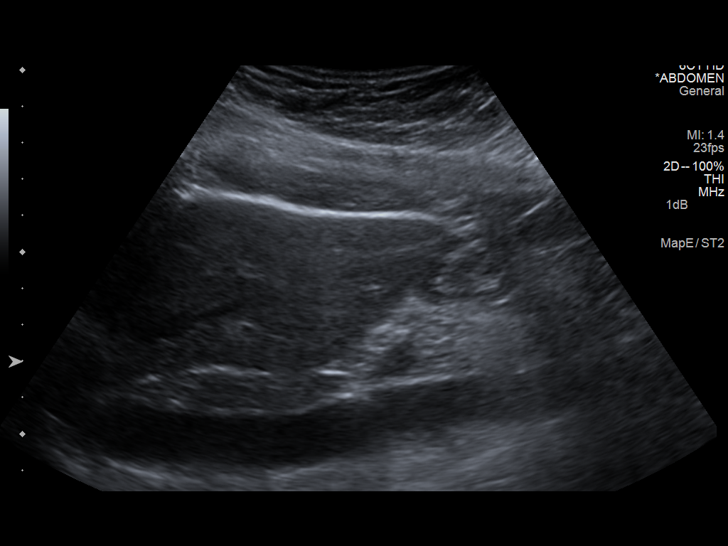
[im 18/72]
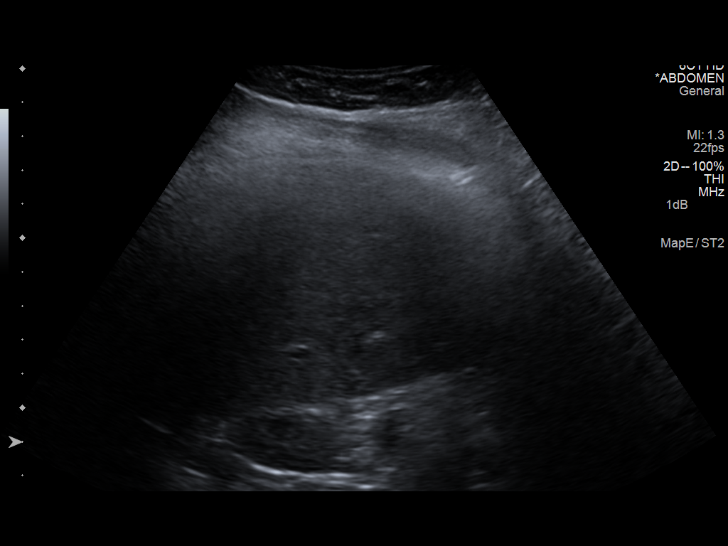
[im 24/72]
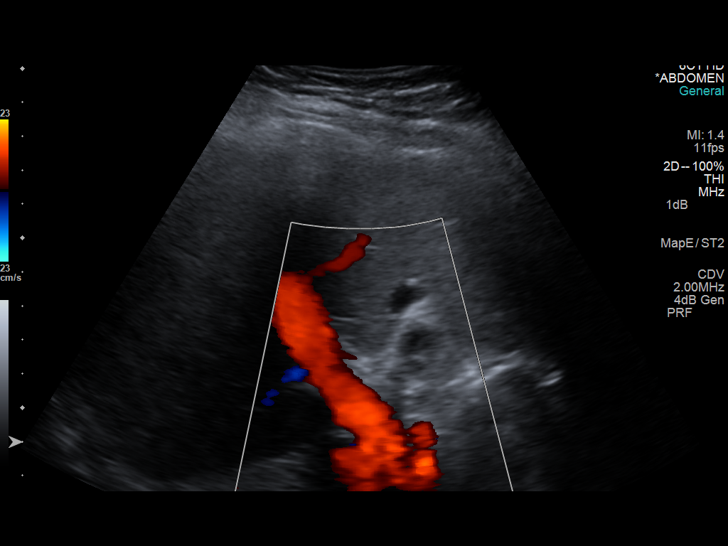
[im 27/72]
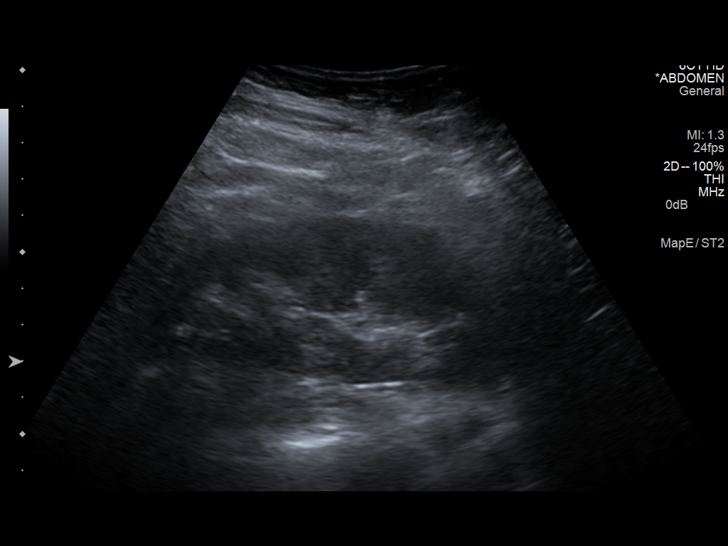
[im 33/72]
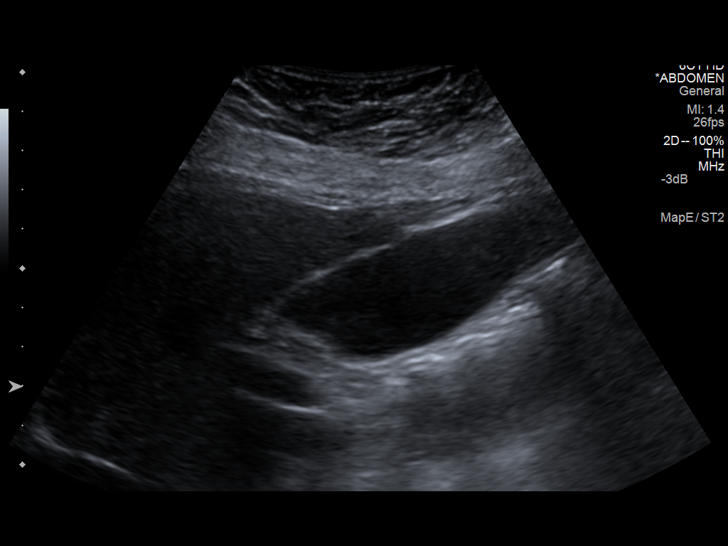
[im 39/72]
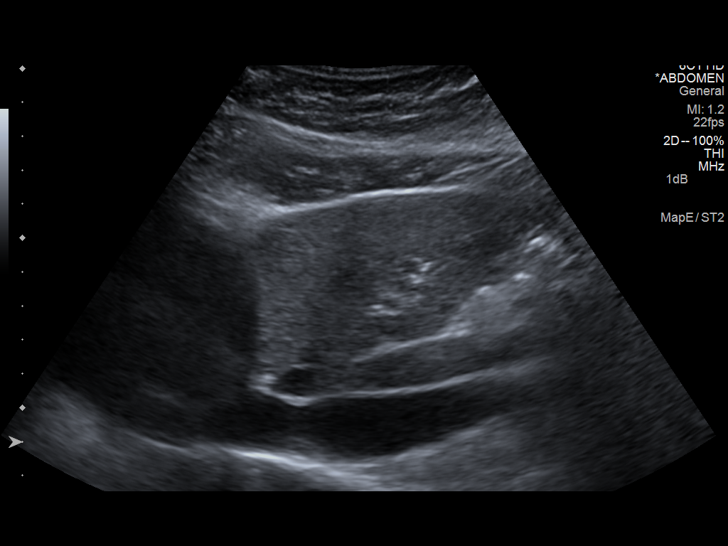
[im 45/72]
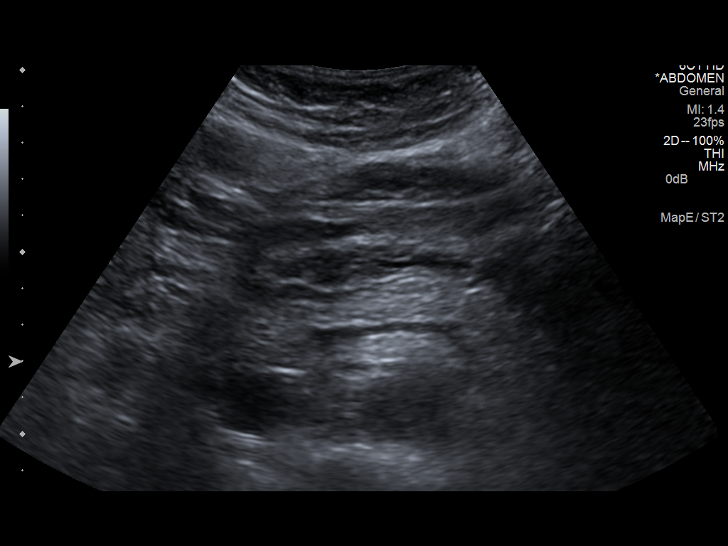
[im 48/72]
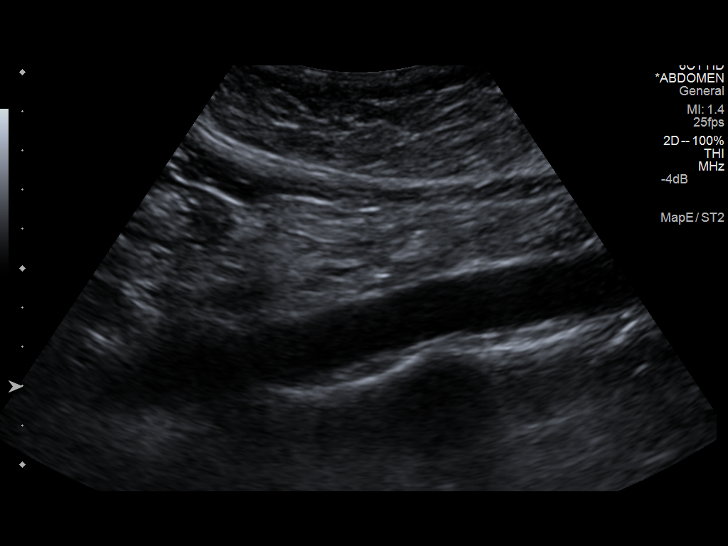
[im 54/72]
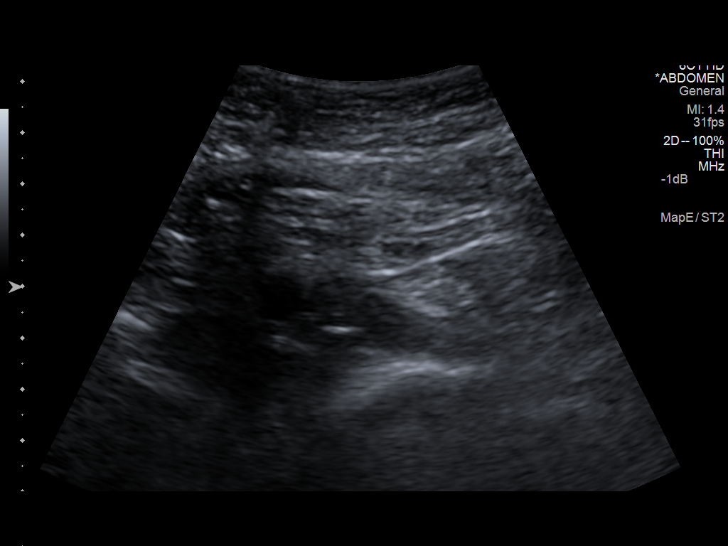
[im 60/72]
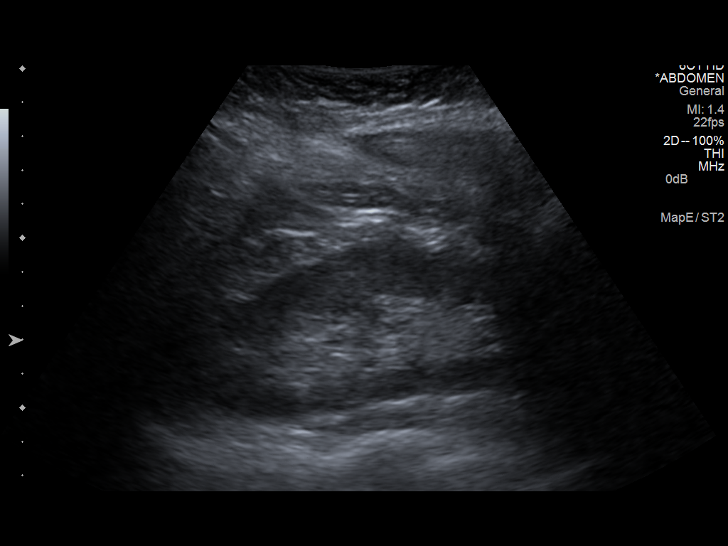
[im 66/72]
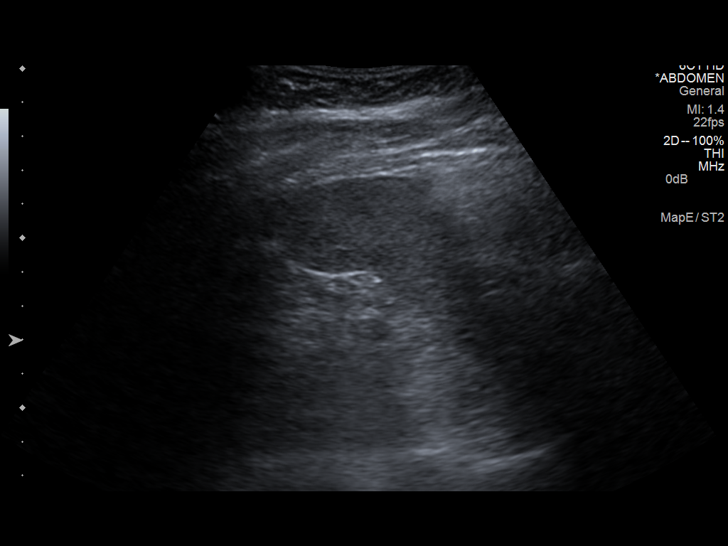
[im 72/72]
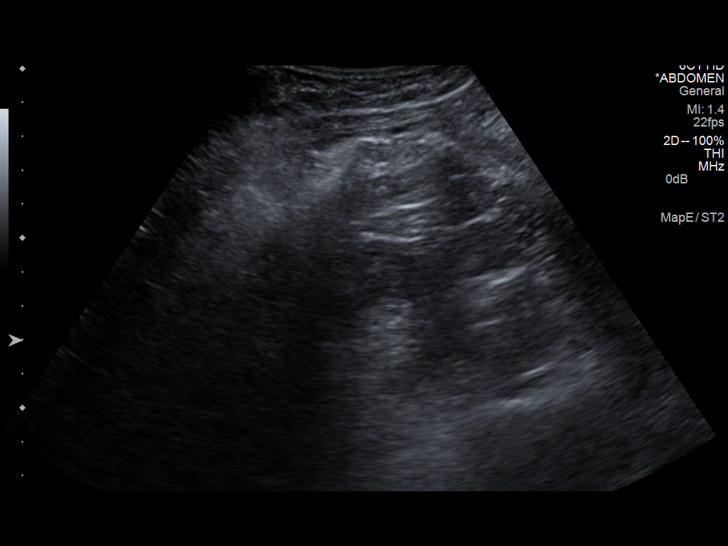

[14 of 25 positions shown; findings below may reference images not displayed]

FINDINGS: Gallbladder:

No gallstones or wall thickening visualized. No sonographic Murphy
sign noted.

Common bile duct:

Diameter: 0.5 cm.

Liver:

No focal lesion identified. Within normal limits in parenchymal
echogenicity.

IVC:

No abnormality visualized.

Pancreas:

Visualized portion unremarkable.

Spleen:

Size and appearance within normal limits.

Right Kidney:

Length: 10.7 cm. Echogenicity within normal limits. No mass or
hydronephrosis visualized.

Left Kidney:

Length: 10.6 cm. Echogenicity within normal limits. No mass or
hydronephrosis visualized.

Abdominal aorta:

No aneurysm visualized.

Other findings:

None.
IMPRESSION: Negative examination.

## 2014-05-12 ENCOUNTER — Other Ambulatory Visit: Payer: Self-pay | Admitting: Cardiology

## 2014-05-12 ENCOUNTER — Ambulatory Visit
Admission: RE | Admit: 2014-05-12 | Discharge: 2014-05-12 | Disposition: A | Discharge status: Home / Self Care | Payer: Medicare Other | Source: Ambulatory Visit | Attending: Cardiology | Admitting: Cardiology

## 2014-05-12 DIAGNOSIS — R0602 Shortness of breath: Secondary | ICD-10-CM

## 2014-05-30 ENCOUNTER — Other Ambulatory Visit: Payer: Self-pay | Admitting: Cardiology

## 2014-05-30 DIAGNOSIS — R0602 Shortness of breath: Secondary | ICD-10-CM

## 2014-05-31 ENCOUNTER — Ambulatory Visit (INDEPENDENT_AMBULATORY_CARE_PROVIDER_SITE_OTHER): Payer: Medicare Other | Admitting: Internal Medicine

## 2014-05-31 DIAGNOSIS — R0602 Shortness of breath: Secondary | ICD-10-CM

## 2014-05-31 LAB — PULMONARY FUNCTION TEST
DL/VA % PRED: 95 %
DL/VA: 4.89 ml/min/mmHg/L
DLCO UNC % PRED: 99 %
DLCO UNC: 28.12 ml/min/mmHg
FEF 25-75 POST: 2.87 L/s
FEF 25-75 PRE: 2.79 L/s
FEF2575-%Change-Post: 2 %
FEF2575-%PRED-POST: 121 %
FEF2575-%PRED-PRE: 118 %
FEV1-%Change-Post: 1 %
FEV1-%Pred-Post: 109 %
FEV1-%Pred-Pre: 107 %
FEV1-POST: 2.99 L
FEV1-Pre: 2.94 L
FEV1FVC-%Change-Post: 2 %
FEV1FVC-%Pred-Pre: 101 %
FEV6-%CHANGE-POST: 0 %
FEV6-%PRED-POST: 107 %
FEV6-%PRED-PRE: 106 %
FEV6-PRE: 3.67 L
FEV6-Post: 3.7 L
FEV6FVC-%PRED-POST: 104 %
FEV6FVC-%Pred-Pre: 104 %
FVC-%CHANGE-POST: -1 %
FVC-%PRED-POST: 103 %
FVC-%Pred-Pre: 104 %
FVC-POST: 3.7 L
FVC-PRE: 3.74 L
POST FEV6/FVC RATIO: 100 %
PRE FEV1/FVC RATIO: 78 %
Post FEV1/FVC ratio: 81 %
Pre FEV6/FVC Ratio: 100 %
RV % pred: 95 %
RV: 2.1 L
TLC % pred: 105 %
TLC: 5.8 L

## 2014-05-31 NOTE — Progress Notes (Signed)
PFT done today. 

## 2015-08-22 ENCOUNTER — Other Ambulatory Visit: Payer: Self-pay | Admitting: Pain Medicine

## 2015-08-23 ENCOUNTER — Other Ambulatory Visit: Payer: Self-pay | Admitting: Pain Medicine

## 2015-08-23 DIAGNOSIS — E063 Autoimmune thyroiditis: Secondary | ICD-10-CM

## 2015-08-25 ENCOUNTER — Ambulatory Visit
Admission: RE | Admit: 2015-08-25 | Discharge: 2015-08-25 | Disposition: A | Discharge status: Home / Self Care | Payer: Medicare Other | Source: Ambulatory Visit | Attending: Pain Medicine | Admitting: Pain Medicine

## 2015-08-25 DIAGNOSIS — E063 Autoimmune thyroiditis: Secondary | ICD-10-CM

## 2016-01-31 IMAGING — US US SOFT TISSUE HEAD/NECK
1 series · 14 of 25 positions shown · non-contrast
Comparison: 06/24/2012

CLINICAL DATA: 64-year-old female with a history of Hashimoto
thyroiditis

EXAM:
THYROID ULTRASOUND
TECHNIQUE: Ultrasound examination of the thyroid gland and adjacent soft
tissues was performed.

[Series 1: us soft tissue head/neck · 0.08mm/px · 14 of 33 slices shown]
[im 1/33]
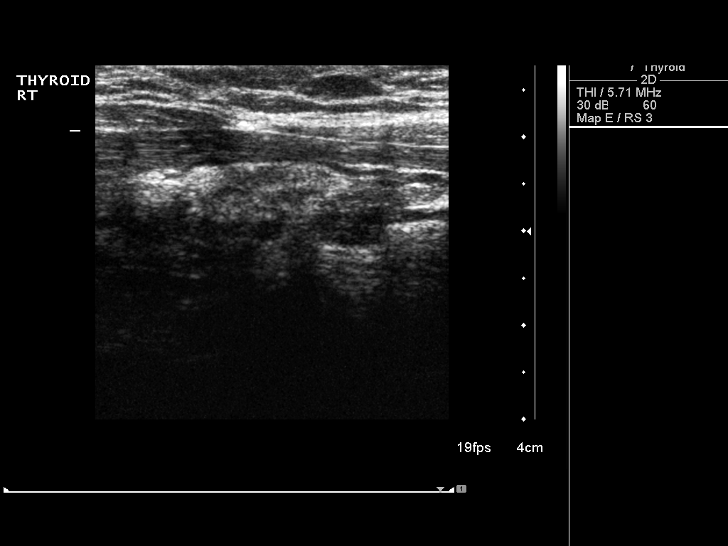
[im 3/33]
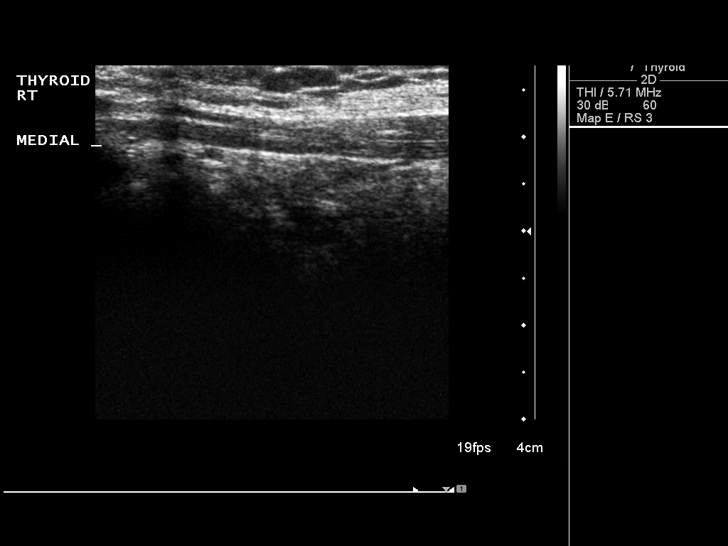
[im 6/33]
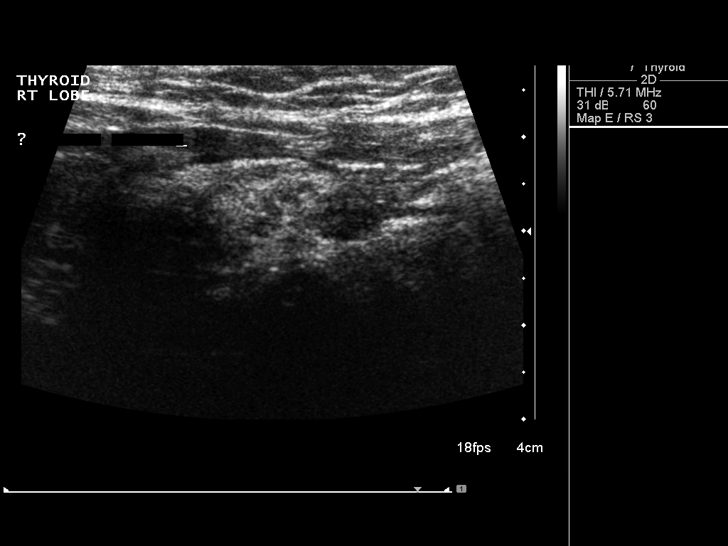
[im 9/33]
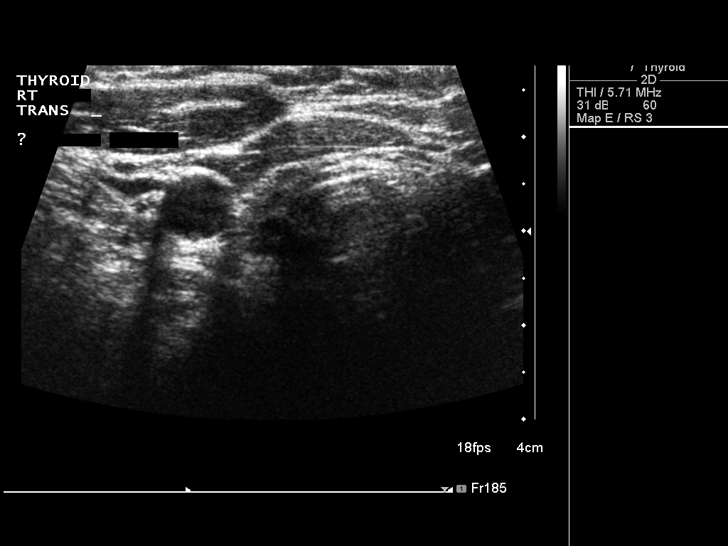
[im 11/33]
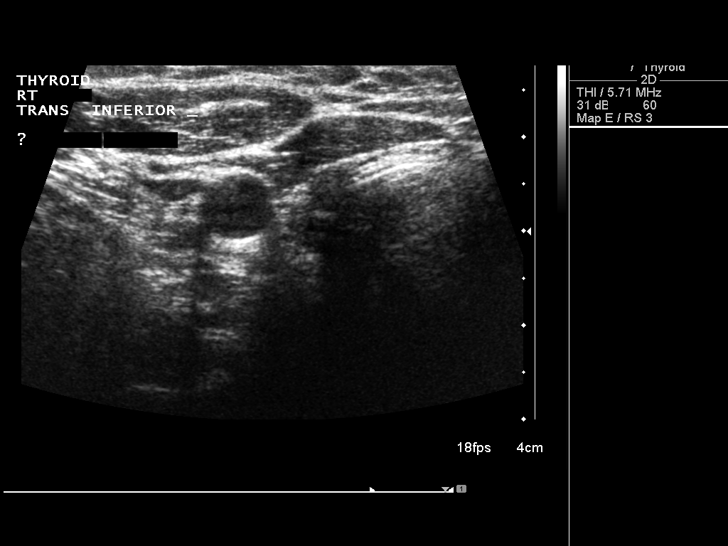
[im 13/33]
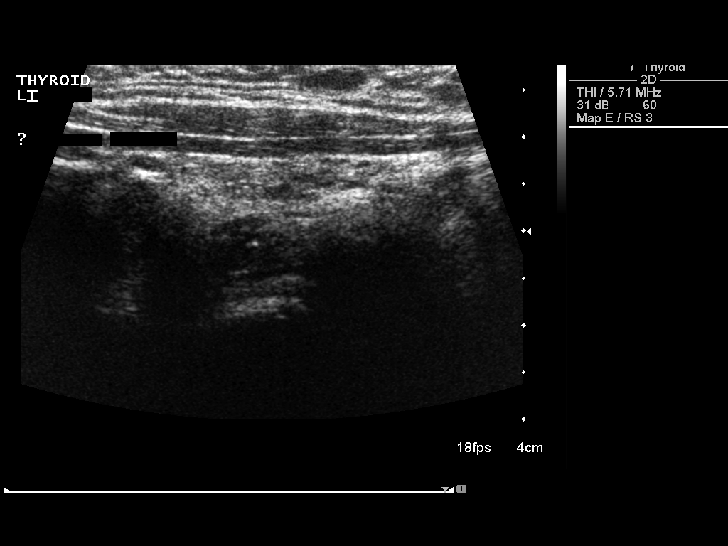
[im 15/33]
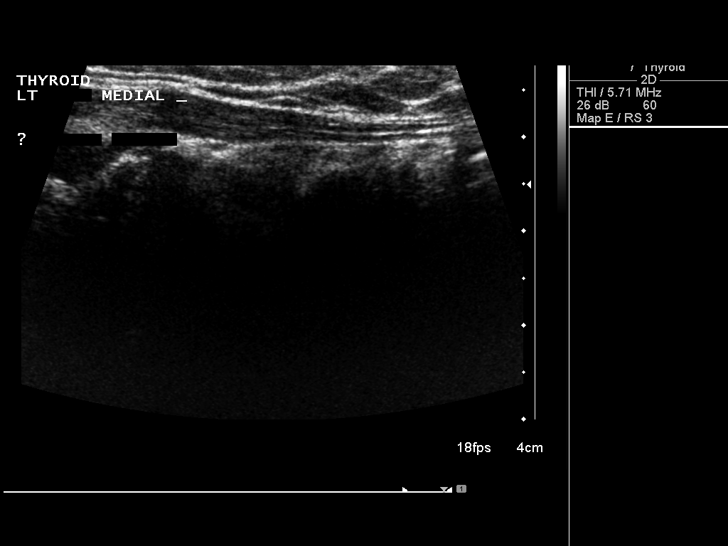
[im 18/33]
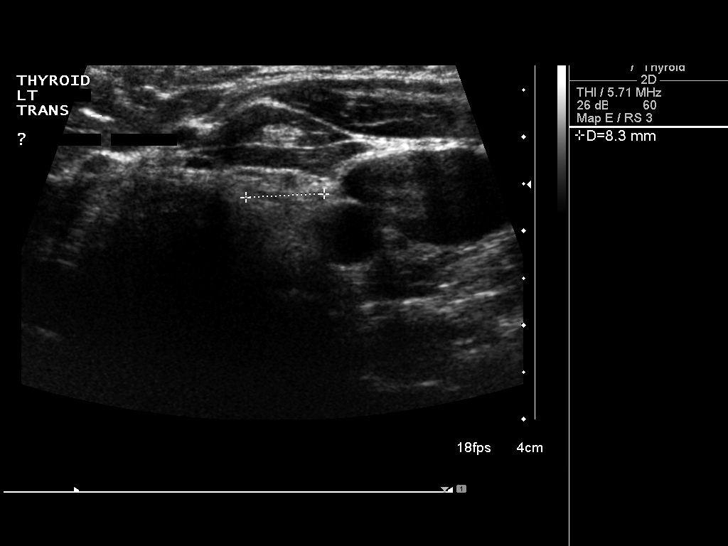
[im 21/33]
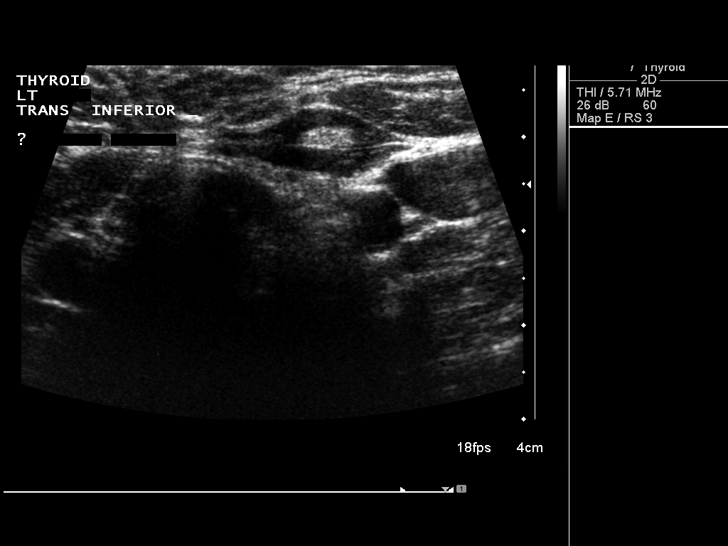
[im 22/33]
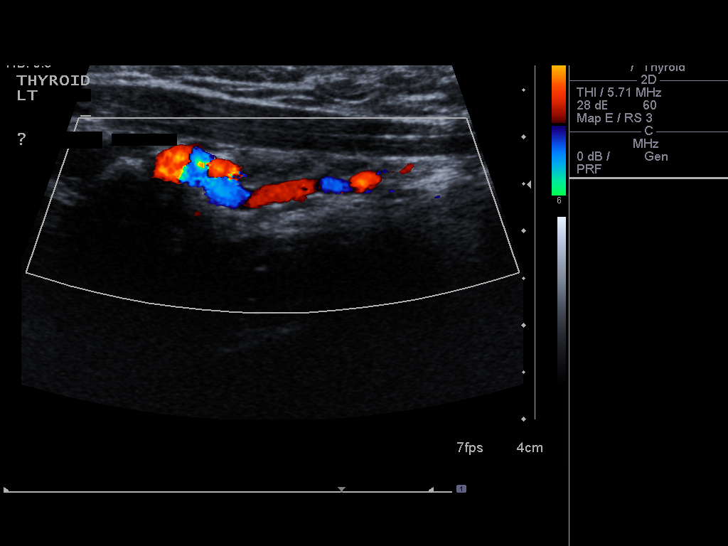
[im 25/33]
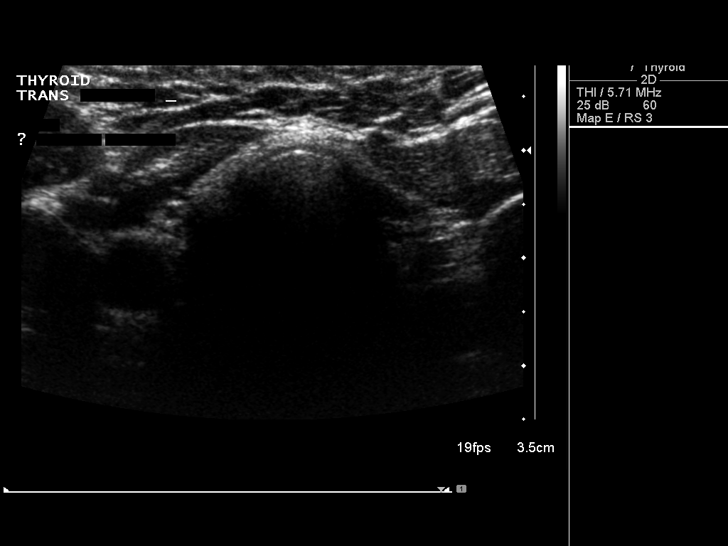
[im 27/33]
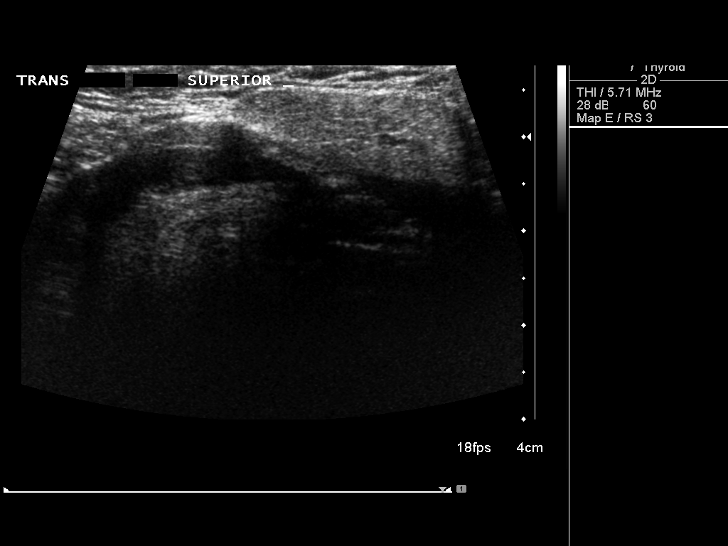
[im 30/33]
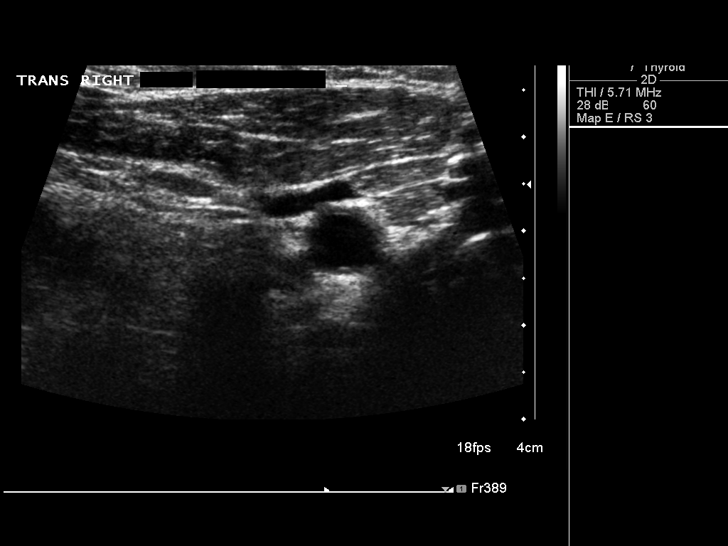
[im 33/33]
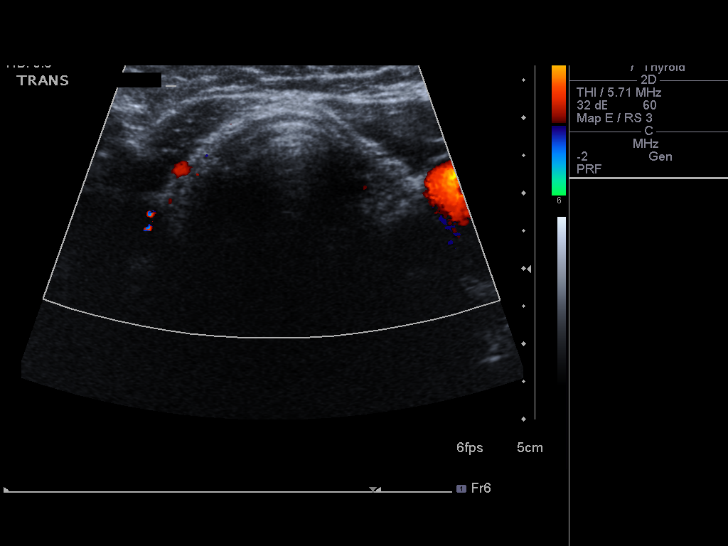

[14 of 25 positions shown; findings below may reference images not displayed]

FINDINGS: Right thyroid lobe

Measurements: 1.8 cm x 0.8 cm x 0.6 cm. Heterogeneous appearance of
the thyroid without significant increased flow.

Left thyroid lobe

Measurements: 2.0 cm x 0.6 cm x 0.8 cm. Heterogeneous appearance of
the thyroid without significant increased flow.

Isthmus

Thickness: 1 mm.  No nodules visualized.

Lymphadenopathy

None visualized.
IMPRESSION: Heterogeneous small thyroid, compatible with history of thyroiditis.

## 2019-04-12 SURGERY — COLONOSCOPY WITH PROPOFOL
Anesthesia: General
